# Patient Record
Sex: Male | Born: 1945 | Race: Black or African American | Hispanic: No | Marital: Married | Smoking: Former smoker
Health system: Southern US, Community
[De-identification: ages and names within clinical notes are randomized; demographics above are authoritative.]

## PROBLEM LIST (undated history)

## (undated) DIAGNOSIS — Z9289 Personal history of other medical treatment: Secondary | ICD-10-CM

## (undated) DIAGNOSIS — E119 Type 2 diabetes mellitus without complications: Secondary | ICD-10-CM

## (undated) DIAGNOSIS — D649 Anemia, unspecified: Secondary | ICD-10-CM

## (undated) DIAGNOSIS — H30039 Focal chorioretinal inflammation, peripheral, unspecified eye: Secondary | ICD-10-CM

## (undated) DIAGNOSIS — C61 Malignant neoplasm of prostate: Secondary | ICD-10-CM

## (undated) DIAGNOSIS — K269 Duodenal ulcer, unspecified as acute or chronic, without hemorrhage or perforation: Secondary | ICD-10-CM

## (undated) DIAGNOSIS — I10 Essential (primary) hypertension: Secondary | ICD-10-CM

## (undated) DIAGNOSIS — H4010X0 Unspecified open-angle glaucoma, stage unspecified: Secondary | ICD-10-CM

## (undated) DIAGNOSIS — H44113 Panuveitis, bilateral: Secondary | ICD-10-CM

## (undated) HISTORY — DX: Anemia, unspecified: D64.9

## (undated) HISTORY — PX: ABDOMINAL SURGERY: SHX537

## (undated) HISTORY — PX: CHOLECYSTECTOMY: SHX55

## (undated) HISTORY — PX: EYE SURGERY: SHX253

## (undated) HISTORY — PX: CATARACT EXTRACTION W/ INTRAOCULAR LENS  IMPLANT, BILATERAL: SHX1307

---

## 2002-12-09 ENCOUNTER — Encounter: Payer: Self-pay | Admitting: Emergency Medicine

## 2002-12-09 ENCOUNTER — Emergency Department (HOSPITAL_COMMUNITY): Admission: EM | Admit: 2002-12-09 | Discharge: 2002-12-09 | Payer: Self-pay | Admitting: Emergency Medicine

## 2004-02-22 ENCOUNTER — Emergency Department (HOSPITAL_COMMUNITY): Admission: EM | Admit: 2004-02-22 | Discharge: 2004-02-23 | Payer: Self-pay | Admitting: Emergency Medicine

## 2004-04-26 ENCOUNTER — Emergency Department (HOSPITAL_COMMUNITY): Admission: EM | Admit: 2004-04-26 | Discharge: 2004-04-27 | Payer: Self-pay | Admitting: Emergency Medicine

## 2008-03-28 ENCOUNTER — Emergency Department (HOSPITAL_COMMUNITY): Admission: EM | Admit: 2008-03-28 | Discharge: 2008-03-28 | Payer: Self-pay | Admitting: Emergency Medicine

## 2008-11-30 ENCOUNTER — Emergency Department (HOSPITAL_COMMUNITY): Admission: EM | Admit: 2008-11-30 | Discharge: 2008-11-30 | Payer: Self-pay | Admitting: Family Medicine

## 2009-08-01 ENCOUNTER — Inpatient Hospital Stay (HOSPITAL_COMMUNITY): Admission: EM | Admit: 2009-08-01 | Discharge: 2009-08-04 | Payer: Self-pay | Admitting: Emergency Medicine

## 2009-08-08 ENCOUNTER — Emergency Department (HOSPITAL_COMMUNITY): Admission: EM | Admit: 2009-08-08 | Discharge: 2009-08-08 | Payer: Self-pay | Admitting: Emergency Medicine

## 2009-08-18 ENCOUNTER — Emergency Department (HOSPITAL_COMMUNITY): Admission: EM | Admit: 2009-08-18 | Discharge: 2009-08-18 | Payer: Self-pay | Admitting: Emergency Medicine

## 2010-06-21 ENCOUNTER — Emergency Department (HOSPITAL_COMMUNITY): Admission: EM | Admit: 2010-06-21 | Discharge: 2010-06-21 | Payer: Self-pay | Admitting: Emergency Medicine

## 2010-11-23 LAB — DIFFERENTIAL
Eosinophils Relative: 1 % (ref 0–5)
Lymphocytes Relative: 18 % (ref 12–46)
Lymphs Abs: 1.1 10*3/uL (ref 0.7–4.0)
Monocytes Absolute: 0.7 10*3/uL (ref 0.1–1.0)
Monocytes Relative: 11 % (ref 3–12)

## 2010-11-23 LAB — COMPREHENSIVE METABOLIC PANEL
AST: 61 U/L — ABNORMAL HIGH (ref 0–37)
Albumin: 3.7 g/dL (ref 3.5–5.2)
Calcium: 9.2 mg/dL (ref 8.4–10.5)
Chloride: 102 mEq/L (ref 96–112)
Creatinine, Ser: 1.17 mg/dL (ref 0.4–1.5)
GFR calc Af Amer: >60 mL/min (ref >60)
Total Protein: 6.8 g/dL (ref 6.0–8.3)

## 2010-11-23 LAB — CBC
MCH: 30.1 pg (ref 26.0–34.0)
MCHC: 33.5 g/dL (ref 30.0–36.0)
Platelets: 175 10*3/uL (ref 150–400)
RBC: 3.75 MIL/uL — ABNORMAL LOW (ref 4.22–5.81)
RDW: 13.2 % (ref 11.5–15.5)

## 2010-11-23 LAB — TYPE AND SCREEN
ABO/RH(D): A POS
Antibody Screen: NEGATIVE

## 2010-11-23 LAB — ABO/RH: ABO/RH(D): A POS

## 2010-12-13 LAB — POCT I-STAT, CHEM 8
Calcium, Ion: 1.18 mmol/L (ref 1.12–1.32)
Hemoglobin: 10.5 g/dL — ABNORMAL LOW (ref 13.0–17.0)
Sodium: 141 mEq/L (ref 135–145)
TCO2: 31 mmol/L (ref 0–100)

## 2010-12-14 LAB — POCT CARDIAC MARKERS
CKMB, poc: <1 ng/mL — ABNORMAL LOW (ref 1.0–8.0)
CKMB, poc: <1 ng/mL — ABNORMAL LOW (ref 1.0–8.0)
Myoglobin, poc: 68 ng/mL (ref 12–200)
Troponin i, poc: <0.05 ng/mL (ref 0.00–0.09)

## 2010-12-14 LAB — DIFFERENTIAL
Eosinophils Absolute: 0 10*3/uL (ref 0.0–0.7)
Eosinophils Relative: 0 % (ref 0–5)
Lymphocytes Relative: 8 % — ABNORMAL LOW (ref 12–46)
Lymphs Abs: 1 10*3/uL (ref 0.7–4.0)
Monocytes Absolute: 0.8 10*3/uL (ref 0.1–1.0)
Monocytes Relative: 7 % (ref 3–12)

## 2010-12-14 LAB — COMPREHENSIVE METABOLIC PANEL
ALT: 26 U/L (ref 0–53)
AST: 30 U/L (ref 0–37)
Albumin: 3.6 g/dL (ref 3.5–5.2)
CO2: 27 mEq/L (ref 19–32)
Calcium: 8 mg/dL — ABNORMAL LOW (ref 8.4–10.5)
Chloride: 102 mEq/L (ref 96–112)
GFR calc Af Amer: >60 mL/min (ref >60)
GFR calc non Af Amer: >60 mL/min (ref >60)
Sodium: 136 mEq/L (ref 135–145)

## 2010-12-14 LAB — CBC
HCT: 38.2 % — ABNORMAL LOW (ref 39.0–52.0)
HCT: 39.5 % (ref 39.0–52.0)
Hemoglobin: 13 g/dL (ref 13.0–17.0)
Hemoglobin: 13.6 g/dL (ref 13.0–17.0)
Hemoglobin: 14.3 g/dL (ref 13.0–17.0)
MCHC: 34.3 g/dL (ref 30.0–36.0)
MCHC: 34.9 g/dL (ref 30.0–36.0)
MCV: 92.8 fL (ref 78.0–100.0)
Platelets: 104 10*3/uL — ABNORMAL LOW (ref 150–400)
Platelets: 70 10*3/uL — ABNORMAL LOW (ref 150–400)
RBC: 4.26 MIL/uL (ref 4.22–5.81)
RBC: 4.28 MIL/uL (ref 4.22–5.81)
RDW: 13.8 % (ref 11.5–15.5)
RDW: 14.2 % (ref 11.5–15.5)
WBC: 11.3 10*3/uL — ABNORMAL HIGH (ref 4.0–10.5)
WBC: 15.8 10*3/uL — ABNORMAL HIGH (ref 4.0–10.5)
WBC: 16.1 10*3/uL — ABNORMAL HIGH (ref 4.0–10.5)
WBC: 20.1 10*3/uL — ABNORMAL HIGH (ref 4.0–10.5)

## 2010-12-14 LAB — CK TOTAL AND CKMB (NOT AT ARMC)
CK, MB: 1.2 ng/mL (ref 0.3–4.0)
CK, MB: 1.4 ng/mL (ref 0.3–4.0)
Relative Index: 0.9 (ref 0.0–2.5)
Relative Index: 1 (ref 0.0–2.5)
Relative Index: INVALID (ref 0.0–2.5)
Total CK: 129 U/L (ref 7–232)
Total CK: 81 U/L (ref 7–232)

## 2010-12-14 LAB — BASIC METABOLIC PANEL
BUN: 3 mg/dL — ABNORMAL LOW (ref 6–23)
CO2: 26 mEq/L (ref 19–32)
Calcium: 8.6 mg/dL (ref 8.4–10.5)
Calcium: 9.1 mg/dL (ref 8.4–10.5)
Creatinine, Ser: 1 mg/dL (ref 0.4–1.5)
GFR calc non Af Amer: >60 mL/min (ref >60)
GFR calc non Af Amer: >60 mL/min (ref >60)
Glucose, Bld: 139 mg/dL — ABNORMAL HIGH (ref 70–99)
Glucose, Bld: 147 mg/dL — ABNORMAL HIGH (ref 70–99)
Glucose, Bld: 160 mg/dL — ABNORMAL HIGH (ref 70–99)
Potassium: 4 mEq/L (ref 3.5–5.1)
Sodium: 136 mEq/L (ref 135–145)
Sodium: 137 mEq/L (ref 135–145)

## 2010-12-14 LAB — URINALYSIS, ROUTINE W REFLEX MICROSCOPIC
Nitrite: NEGATIVE
Specific Gravity, Urine: 1.022 (ref 1.005–1.030)
Urobilinogen, UA: 1 mg/dL (ref 0.0–1.0)
pH: 7 (ref 5.0–8.0)

## 2010-12-14 LAB — CULTURE, BLOOD (ROUTINE X 2)

## 2010-12-14 LAB — LIPASE, BLOOD: Lipase: 13 U/L (ref 11–59)

## 2011-02-24 ENCOUNTER — Emergency Department (HOSPITAL_COMMUNITY)
Admission: EM | Admit: 2011-02-24 | Discharge: 2011-02-24 | Disposition: A | Discharge status: Home / Self Care | Payer: Self-pay | Attending: Emergency Medicine | Admitting: Emergency Medicine

## 2011-02-24 DIAGNOSIS — J3489 Other specified disorders of nose and nasal sinuses: Secondary | ICD-10-CM | POA: Insufficient documentation

## 2011-02-24 DIAGNOSIS — J029 Acute pharyngitis, unspecified: Secondary | ICD-10-CM | POA: Insufficient documentation

## 2011-12-05 ENCOUNTER — Emergency Department (HOSPITAL_COMMUNITY)
Admission: EM | Admit: 2011-12-05 | Discharge: 2011-12-05 | Disposition: A | Discharge status: Home / Self Care | Payer: Self-pay | Attending: Emergency Medicine | Admitting: Emergency Medicine

## 2011-12-05 ENCOUNTER — Emergency Department (HOSPITAL_COMMUNITY): Payer: Self-pay

## 2011-12-05 ENCOUNTER — Encounter (HOSPITAL_COMMUNITY): Payer: Self-pay | Admitting: *Deleted

## 2011-12-05 DIAGNOSIS — W010XXA Fall on same level from slipping, tripping and stumbling without subsequent striking against object, initial encounter: Secondary | ICD-10-CM | POA: Insufficient documentation

## 2011-12-05 DIAGNOSIS — M545 Low back pain, unspecified: Secondary | ICD-10-CM

## 2011-12-05 DIAGNOSIS — M5430 Sciatica, unspecified side: Secondary | ICD-10-CM

## 2011-12-05 DIAGNOSIS — M25559 Pain in unspecified hip: Secondary | ICD-10-CM | POA: Insufficient documentation

## 2011-12-05 MED ORDER — PREDNISONE 20 MG PO TABS: 40.0000 mg | ORAL_TABLET | Freq: Every day | ORAL | Status: AC

## 2011-12-05 MED ORDER — PREDNISONE 20 MG PO TABS
40.0000 mg | ORAL_TABLET | Freq: Once | ORAL | Status: AC
  Administered 2011-12-05: 40 mg via ORAL
  Filled 2011-12-05: qty 2

## 2011-12-05 MED ORDER — METAXALONE 800 MG PO TABS: 800.0000 mg | ORAL_TABLET | Freq: Three times a day (TID) | ORAL | Status: DC

## 2011-12-05 MED ORDER — PREDNISONE 20 MG PO TABS: 40.0000 mg | ORAL_TABLET | Freq: Every day | ORAL | Status: DC

## 2011-12-05 MED ORDER — HYDROCODONE-ACETAMINOPHEN 5-325 MG PO TABS
ORAL_TABLET | ORAL | Status: DC
Start: 2011-12-05 — End: 2012-01-12

## 2011-12-05 MED ORDER — HYDROCODONE-ACETAMINOPHEN 5-325 MG PO TABS
2.0000 | ORAL_TABLET | Freq: Once | ORAL | Status: AC
  Administered 2011-12-05: 2 via ORAL
  Filled 2011-12-05: qty 2

## 2011-12-05 MED ORDER — METAXALONE 800 MG PO TABS: 800.0000 mg | ORAL_TABLET | Freq: Three times a day (TID) | ORAL | Status: AC

## 2011-12-05 MED ORDER — HYDROCODONE-ACETAMINOPHEN 5-325 MG PO TABS
ORAL_TABLET | ORAL | Status: DC
Start: 2011-12-05 — End: 2011-12-05

## 2011-12-05 NOTE — Discharge Instructions (Signed)
Back Pain, Adult Low back pain is very common. About 1 in 5 people have back pain.The cause of low back pain is rarely dangerous. The pain often gets better over time.About half of people with a sudden onset of back pain feel better in just 2 weeks. About 8 in 10 people feel better by 6 weeks.  CAUSES Some common causes of back pain include:  Strain of the muscles or ligaments supporting the spine.   Wear and tear (degeneration) of the spinal discs.   Arthritis.   Direct injury to the back.  DIAGNOSIS Most of the time, the direct cause of low back pain is not known.However, back pain can be treated effectively even when the exact cause of the pain is unknown.Answering your caregiver's questions about your overall health and symptoms is one of the most accurate ways to make sure the cause of your pain is not dangerous. If your caregiver needs more information, he or she may order lab work or imaging tests (X-rays or MRIs).However, even if imaging tests show changes in your back, this usually does not require surgery. HOME CARE INSTRUCTIONS For many people, back pain returns.Since low back pain is rarely dangerous, it is often a condition that people can learn to manageon their own.   Remain active. It is stressful on the back to sit or stand in one place. Do not sit, drive, or stand in one place for more than 30 minutes at a time. Take short walks on level surfaces as soon as pain allows.Try to increase the length of time you walk each day.   Do not stay in bed.Resting more than 1 or 2 days can delay your recovery.   Do not avoid exercise or work.Your body is made to move.It is not dangerous to be active, even though your back may hurt.Your back will likely heal faster if you return to being active before your pain is gone.   Pay attention to your body when you bend and lift. Many people have less discomfortwhen lifting if they bend their knees, keep the load close to their  bodies,and avoid twisting. Often, the most comfortable positions are those that put less stress on your recovering back.   Find a comfortable position to sleep. Use a firm mattress and lie on your side with your knees slightly bent. If you lie on your back, put a pillow under your knees.   Only take over-the-counter or prescription medicines as directed by your caregiver. Over-the-counter medicines to reduce pain and inflammation are often the most helpful.Your caregiver may prescribe muscle relaxant drugs.These medicines help dull your pain so you can more quickly return to your normal activities and healthy exercise.   Put ice on the injured area.   Put ice in a plastic bag.   Place a towel between your skin and the bag.   Leave the ice on for 15 to 20 minutes, 3 to 4 times a day for the first 2 to 3 days. After that, ice and heat may be alternated to reduce pain and spasms.   Ask your caregiver about trying back exercises and gentle massage. This may be of some benefit.   Avoid feeling anxious or stressed.Stress increases muscle tension and can worsen back pain.It is important to recognize when you are anxious or stressed and learn ways to manage it.Exercise is a great option.  SEEK MEDICAL CARE IF:  You have pain that is not relieved with rest or medicine.   You have   pain that does not improve in 1 week.   You have new symptoms.   You are generally not feeling well.  SEEK IMMEDIATE MEDICAL CARE IF:   You have pain that radiates from your back into your legs.   You develop new bowel or bladder control problems.   You have unusual weakness or numbness in your arms or legs.   You develop nausea or vomiting.   You develop abdominal pain.   You feel faint.  Document Released: 08/28/2005 Document Revised: 08/17/2011 Document Reviewed: 01/16/2011 [facility] Patient Information 2012 [place], [place].     Sciatica Sciatica is a weakness and/or changes in sensation  (tingling, jolts, hot and cold, numbness) along the path the sciatic nerve travels. Irritation or damage to lumbar nerve roots is often also referred to as lumbar radiculopathy.  Lumbar radiculopathy (Sciatica) is the most common form of this problem. Radiculopathy can occur in any of the nerves coming out of the spinal cord. The problems caused depend on which nerves are involved. The sciatic nerve is the large nerve supplying the branches of nerves going from the hip to the toes. It often causes a numbness or weakness in the skin and/or muscles that the sciatic nerve serves. It also may cause symptoms (problems) of pain, burning, tingling, or electric shock-like feelings in the path of this nerve. This usually comes from injury to the fibers that make up the sciatic nerve. Some of these symptoms are low back pain and/or unpleasant feelings in the following areas:  From the mid-buttock down the back of the leg to the back of the knee.   And/or the outside of the calf and top of the foot.   And/or behind the inner ankle to the sole of the foot.  CAUSES   Herniated or slipped disc. Discs are the little cushions between the bones in the back.   Pressure by the piriformis muscle in the buttock on the sciatic nerve (Piriformis Syndrome).   Misalignment of the bones in the lower back and buttocks (Sacroiliac Joint Derangement).   Narrowing of the spinal canal that puts pressure on or pinches the fibers that make up the sciatic nerve.   A slipped vertebra that is out of line with those above or beneath it.   Abnormality of the nervous system itself so that nerve fibers do not transmit signals properly, especially to feet and calves (neuropathy).   Tumor (this is rare).  Your caregiver can usually determine the cause of your sciatica and begin the treatment most likely to help you. TREATMENT  Taking over-the-counter painkillers, physical therapy, rest, exercise, spinal manipulation, and injections  of anesthetics and/or steroids may be used. Surgery, acupuncture, and Yoga can also be effective. Mind over matter techniques, mental imagery, and changing factors such as your bed, chair, desk height, posture, and activities are other treatments that may be helpful. You and your caregiver can help determine what is best for you. With proper diagnosis, the cause of most sciatica can be identified and removed. Communication and cooperation between your caregiver and you is essential. If you are not successful immediately, do not be discouraged. With time, a proper treatment can be found that will make you comfortable. HOME CARE INSTRUCTIONS   If the pain is coming from a problem in the back, applying ice to that area for 15 to 20 minutes, 3 to 4 times per day while awake, may be helpful. Put the ice in a plastic bag. Place a towel between the  bag of ice and your skin.   You may exercise or perform your usual activities if these do not aggravate your pain, or as suggested by your caregiver.   Only take over-the-counter or prescription medicines for pain, discomfort, or fever as directed by your caregiver.   If your caregiver has given you a follow-up appointment, it is very important to keep that appointment. Not keeping the appointment could result in a chronic or permanent injury, pain, and disability. If there is any problem keeping the appointment, you must call back to this facility for assistance.  SEEK IMMEDIATE MEDICAL CARE IF:   You experience loss of control of bowel or bladder.   You have increasing weakness in the trunk, buttocks, or legs.   There is numbness in any areas from the hip down to the toes.   You have difficulty walking or keeping your balance.   You have any of the above, with fever or forceful vomiting.  Document Released: 08/22/2001 Document Revised: 08/17/2011 Document Reviewed: 04/10/2008 [facility] Patient Information 2012 [place], [place].    Narcotic and  benzodiazepine use may cause drowsiness, slowed breathing or dependence.  Please use with caution and do not drive, operate machinery or watch young children alone while taking them.  Taking combinations of these medications or drinking alcohol will potentiate these effects.

## 2011-12-05 NOTE — ED Notes (Signed)
Pt hurt his hip when he fell 10 days ago.  Pt continues to have soreness in his left hip, he states that he thinks he has a "pulled muscle" also.  Ambulatory. No weakness

## 2011-12-05 NOTE — ED Notes (Signed)
Pt fell on his R side, but twisted, 10 days prior.  Pain is getting increasingly worse.  Pain increases with bending, and when he first starts standing from a sitting position.

## 2011-12-05 NOTE — ED Provider Notes (Signed)
History     CSN: [id number]  Arrival date & time 12/05/11  0142   First MD Initiated Contact with Patient 12/05/11 0424      Chief Complaint  Patient presents with  . Hip Pain    (Consider location/radiation/quality/duration/timing/severity/associated sxs/prior treatment) HPI Comments: Pt reports he did fall onto right side after tripping about 7 days ago, a few days later has been having pain to left low back with radiation into hip area.  No weakness, no fevers, chills, no weakness to leg, has been able to walk.  Denies any other sig trauma, no bowel or bladder incontinence.  Has not taken any OTC meds, just icy hot to area tonight without much relief.  No skin rash.  No abd pain, N/V/D.    The history is provided by the patient.    History reviewed. No pertinent past medical history.  History reviewed. No pertinent past surgical history.  No family history on file.  History  Substance Use Topics  . Smoking status: Current Everyday Smoker -- 0.5 packs/day    Types: Cigarettes  . Smokeless tobacco: Not on file  . Alcohol Use: Yes      Review of Systems  Constitutional: Negative for fever and chills.  Gastrointestinal: Negative for nausea, vomiting, abdominal pain, diarrhea and blood in stool.  Musculoskeletal: Positive for back pain and arthralgias.  Skin: Negative for rash and wound.  Neurological: Negative for weakness and numbness.    Allergies  Penicillins  Home Medications   Current Outpatient Rx  Name Route Sig Dispense Refill  . HYDROCODONE-ACETAMINOPHEN 5-325 MG PO TABS  1-2 tablets po q 6 hours prn moderate to severe pain 20 tablet 0  . METAXALONE 800 MG PO TABS Oral Take 1 tablet (800 mg total) by mouth 3 (three) times daily. 21 tablet 0  . PREDNISONE 20 MG PO TABS Oral Take 2 tablets (40 mg total) by mouth daily. 12 tablet 0    BP 138/85  Pulse 89  Temp(Src) 98.8 F (37.1 C) (Oral)  Resp 18  SpO2 98%  Physical Exam  Nursing note and vitals  reviewed. Constitutional: He appears well-developed and well-nourished.  HENT:  Head: Normocephalic and atraumatic.  Eyes: Pupils are equal, round, and reactive to light.  Cardiovascular: Normal rate and regular rhythm.   Pulmonary/Chest: Effort normal.  Abdominal: Soft. Bowel sounds are normal. He exhibits no distension. There is no tenderness.  Neurological: He is alert. He has normal strength. No sensory deficit.  Reflex Scores:      Patellar reflexes are 2+ on the right side and 2+ on the left side. Skin: Skin is warm.    ED Course  Procedures (including critical care time)  Labs Reviewed - No data to display Dg Hip Complete Left  12/05/2011  *RADIOLOGY REPORT*  Clinical Data: Worsening left hip pain.  LEFT HIP - COMPLETE 2+ VIEW  Comparison: None.  Findings: There is no evidence of fracture or dislocation.  Both femoral heads are seated normally within their respective acetabula.  The proximal left femur appears intact.  No significant degenerative change is appreciated.  The sacroiliac joints are unremarkable in appearance.  The visualized bowel gas pattern is grossly unremarkable in appearance.  Scattered phleboliths are noted within the pelvis.  IMPRESSION: No evidence of fracture or dislocation.  Original Report Authenticated By: Tonia Ghent, M.D.     1. Sciatica   2. Low back pain       MDM  Exam is consistent with sciatica  and low back pains.  Plain film of hip ordered by protocol prior to my assessment.  Will try prednisone, skelaxin and norco at home.  Pt is urged to follow up with PCP for follow up in 1 week.  He can return for sudden worse pain, fevers, weakness.          Gavin Pound. Farhan Jean, MD 12/05/11 0430

## 2012-01-12 ENCOUNTER — Emergency Department (HOSPITAL_COMMUNITY)
Admission: EM | Admit: 2012-01-12 | Discharge: 2012-01-13 | Disposition: A | Discharge status: Home / Self Care | Payer: Self-pay | Attending: Emergency Medicine | Admitting: Emergency Medicine

## 2012-01-12 ENCOUNTER — Encounter (HOSPITAL_COMMUNITY): Payer: Self-pay | Admitting: *Deleted

## 2012-01-12 DIAGNOSIS — R0602 Shortness of breath: Secondary | ICD-10-CM | POA: Insufficient documentation

## 2012-01-12 DIAGNOSIS — F17200 Nicotine dependence, unspecified, uncomplicated: Secondary | ICD-10-CM | POA: Insufficient documentation

## 2012-01-12 DIAGNOSIS — J029 Acute pharyngitis, unspecified: Secondary | ICD-10-CM | POA: Insufficient documentation

## 2012-01-12 NOTE — ED Notes (Addendum)
C/o sore throat, hurts to swallow, cough, throat swelling, hard to breathe when lying down, BMs loose, (able to keep liquids down), (denies: nv, dizziness or fever). Throat red, irritated, swollen, R=L, neck tender to palpation, no obvious exudate. Now denies cough. Speech alterred.

## 2012-01-13 LAB — DIFFERENTIAL
Eosinophils Absolute: 0.1 10*3/uL (ref 0.0–0.7)
Eosinophils Relative: 1 % (ref 0–5)
Lymphs Abs: 1.2 10*3/uL (ref 0.7–4.0)
Monocytes Relative: 13 % — ABNORMAL HIGH (ref 3–12)

## 2012-01-13 LAB — CBC
Hemoglobin: 13.1 g/dL (ref 13.0–17.0)
MCH: 30 pg (ref 26.0–34.0)
MCV: 90.1 fL (ref 78.0–100.0)
RBC: 4.36 MIL/uL (ref 4.22–5.81)

## 2012-01-13 MED ORDER — HYDROCODONE-ACETAMINOPHEN 5-325 MG PO TABS: 1.0000 | ORAL_TABLET | Freq: Four times a day (QID) | ORAL | Status: AC | PRN

## 2012-01-13 NOTE — ED Provider Notes (Signed)
History     CSN: [id number]  Arrival date & time 01/12/12  2320   First MD Initiated Contact with Patient 01/13/12 0014      Chief Complaint  Patient presents with  . Sore Throat  . Shortness of Breath    (Consider location/radiation/quality/duration/timing/severity/associated sxs/prior treatment) Patient is a 66 y.o. male presenting with pharyngitis.  Sore Throat This is a new problem. The current episode started more than 2 days ago. The problem occurs constantly. The problem has not changed since onset.Associated symptoms include shortness of breath. Pertinent negatives include no chest pain, no abdominal pain and no headaches. The symptoms are aggravated by swallowing. The symptoms are relieved by nothing.   History of sore throat for the past 3 days no real exertional shortness of breath just occasionally feels a little short of breath with the pain of the throat when trying to swallow but no shortness of breath with walking around. No significant cough or productive cough some mild congestion which he attributed to seasonal allergies and does not feel like he has an upper respiratory infection no true fever. Has been taking cold and flu-type medicine. Patient rates the throat pain as a 10 out of 10. Not associated with chest pain headache abdominal pain nausea or vomiting or diarrhea. History reviewed. No pertinent past medical history.  Past Surgical History  Procedure Date  . Cholecystectomy   . Abdominal surgery     No family history on file.  History  Substance Use Topics  . Smoking status: Current Everyday Smoker -- 0.5 packs/day    Types: Cigarettes  . Smokeless tobacco: Not on file  . Alcohol Use: Yes      Review of Systems  Constitutional: Negative for fever and chills.  HENT: Positive for congestion, sore throat and trouble swallowing. Negative for ear pain and neck pain.   Eyes: Negative for redness.  Respiratory: Positive for shortness of breath.     Cardiovascular: Negative for chest pain.  Gastrointestinal: Negative for nausea, vomiting, abdominal pain and diarrhea.  Genitourinary: Negative for dysuria.  Musculoskeletal: Negative for back pain.  Skin: Negative for rash.  Neurological: Negative for headaches.  Hematological: Negative for adenopathy.    Allergies  Penicillins  Home Medications   Current Outpatient Rx  Name Route Sig Dispense Refill  . ALKA-SELTZER PLUS DAY COLD/FLU PO Oral Take 2 tablets by mouth once.    Juanita Laster COLD & SORE THROAT PO Oral Take 1 tablet by mouth once. Sore throat and cold    . ALKA-SELTZER PLS NIGHT CLD/FLU PO Oral Take 2 tablets by mouth once. Sore throat and  cold    . HYDROCODONE-ACETAMINOPHEN 5-325 MG PO TABS Oral Take 1-2 tablets by mouth every 6 (six) hours as needed for pain. 10 tablet 0    BP 153/91  Pulse 74  Temp(Src) 99.8 F (37.7 C) (Oral)  Resp 22  SpO2 99%  Physical Exam  Nursing note and vitals reviewed. Constitutional: He is oriented to person, place, and time. He appears well-developed and well-nourished. No distress.  HENT:  Head: Normocephalic and atraumatic.  Mouth/Throat: Oropharynx is clear and moist. No oropharyngeal exudate.  Eyes: Conjunctivae and EOM are normal. Pupils are equal, round, and reactive to light.  Neck: Normal range of motion. Neck supple. No tracheal deviation present. No thyromegaly present.  Cardiovascular: Normal rate, regular rhythm and normal heart sounds.   No murmur heard. Pulmonary/Chest: Effort normal and breath sounds normal. No stridor. No respiratory distress. He has no  wheezes. He has no rales. He exhibits no tenderness.  Abdominal: Soft. Bowel sounds are normal. There is no tenderness.  Musculoskeletal: Normal range of motion. He exhibits no edema and no tenderness.  Lymphadenopathy:    He has no cervical adenopathy.  Neurological: He is alert and oriented to person, place, and time. No cranial nerve deficit. He exhibits normal  muscle tone. Coordination normal.  Skin: Skin is warm. No rash noted.    ED Course  Procedures (including critical care time)  Labs Reviewed  CBC - Abnormal; Notable for the following:    WBC 11.1 (*)    All other components within normal limits  DIFFERENTIAL - Abnormal; Notable for the following:    Neutro Abs 8.4 (*)    Lymphocytes Relative 11 (*)    Monocytes Relative 13 (*)    Monocytes Absolute 1.4 (*)    All other components within normal limits  RAPID STREP SCREEN   No results found.   1. Pharyngitis       MDM  Patient with 3 day history of sore throat some mild congestion he thinks related more to allergies but does not feel like an upper respiratory infection to the patient. Today's workup negative for strep pharyngitis based on the rapid strep test throat without any significant abnormalities or significant erythema or exudate. Patient does have a history of some seasonal allergies could be related to that somewhat recommended trying Zrytec the antihistamine if not resolved in the next 3 days to follow up with ear nose and throat for a closer look to make sure is no significant laryngeal pathology going on. It is still possible that a viral upper respiratory infection is the etiology for the pharyngitis.        Shelda Jakes, MD 01/13/12 0157

## 2012-01-13 NOTE — Discharge Instructions (Signed)
Sore Throat A sore throat is felt inside the throat and at the back of the mouth. It hurts to swallow or the throat may feel dry and scratchy. It can be caused by germs, smoking, pollution, or allergies.  HOME CARE   Only take medicine as told by your doctor.   Drink enough fluids to keep your pee (urine) clear or pale yellow.   Eat soft foods.   Do not smoke.   Rinse the mouth (gargle) with warm water or salt water ( teaspoon salt in 8 ounces of water).   Try throat sprays, lozenges, or suck on hard candy.  GET HELP RIGHT AWAY IF:   You have trouble breathing.   Your sore throat lasts longer than 1 week.   There is more puffiness (swelling) in the throat.   The pain is so bad that you are unable to swallow.   You have a very bad headache or a red rash.   You start to throw up (vomit).   You or your child has a temperature by mouth above 102 F (38.9 C), not controlled by medicine.   Your baby is older than 3 months with a rectal temperature of 102 F (38.9 C) or higher.   Your baby is 74 months old or younger with a rectal temperature of 100.4 F (38 C) or higher.  MAKE SURE YOU:   Understand these instructions.   Will watch your condition.   Will get help right away if you are not doing well or get worse.  Document Released: 06/06/2008 Document Revised: 08/17/2011 Document Reviewed: 06/06/2008 [facility] Patient Information 2012 [place], [place].    Sore throat may be viral in nature or may not be related to a virus at all. If not resolved in the next 3 days important that you follow up with ear nose and throat to take a closer look at the potential cause. Based on today's testing not related to strep. Return for any new or worse symptoms.

## 2012-03-13 ENCOUNTER — Emergency Department (HOSPITAL_COMMUNITY)
Admission: EM | Admit: 2012-03-13 | Discharge: 2012-03-13 | Disposition: A | Discharge status: Home / Self Care | Payer: Medicaid Other | Attending: Emergency Medicine | Admitting: Emergency Medicine

## 2012-03-13 ENCOUNTER — Encounter (HOSPITAL_COMMUNITY): Payer: Self-pay

## 2012-03-13 DIAGNOSIS — T148XXA Other injury of unspecified body region, initial encounter: Secondary | ICD-10-CM

## 2012-03-13 DIAGNOSIS — F17200 Nicotine dependence, unspecified, uncomplicated: Secondary | ICD-10-CM | POA: Insufficient documentation

## 2012-03-13 DIAGNOSIS — M5430 Sciatica, unspecified side: Secondary | ICD-10-CM | POA: Insufficient documentation

## 2012-03-13 LAB — POCT I-STAT, CHEM 8
BUN: 5 mg/dL — ABNORMAL LOW (ref 6–23)
Calcium, Ion: 1.25 mmol/L (ref 1.12–1.32)
Creatinine, Ser: 1.2 mg/dL (ref 0.50–1.35)
TCO2: 28 mmol/L (ref 0–100)

## 2012-03-13 MED ORDER — CYCLOBENZAPRINE HCL 5 MG PO TABS: 5.0000 mg | ORAL_TABLET | Freq: Three times a day (TID) | ORAL | Status: AC | PRN

## 2012-03-13 MED ORDER — HYDROCODONE-ACETAMINOPHEN 5-325 MG PO TABS: 1.0000 | ORAL_TABLET | Freq: Four times a day (QID) | ORAL | Status: AC | PRN

## 2012-03-13 MED ORDER — OXYCODONE-ACETAMINOPHEN 5-325 MG PO TABS
1.0000 | ORAL_TABLET | Freq: Once | ORAL | Status: AC
  Administered 2012-03-13: 1 via ORAL
  Filled 2012-03-13: qty 1

## 2012-03-13 MED ORDER — KETOROLAC TROMETHAMINE 60 MG/2ML IM SOLN
60.0000 mg | Freq: Once | INTRAMUSCULAR | Status: AC
  Administered 2012-03-13: 60 mg via INTRAMUSCULAR
  Filled 2012-03-13: qty 2

## 2012-03-13 MED ORDER — DOCUSATE SODIUM 100 MG PO CAPS: 100.0000 mg | ORAL_CAPSULE | Freq: Two times a day (BID) | ORAL | Status: AC

## 2012-03-13 MED ORDER — IBUPROFEN 600 MG PO TABS: 600.0000 mg | ORAL_TABLET | Freq: Two times a day (BID) | ORAL | Status: AC

## 2012-03-13 NOTE — ED Notes (Signed)
Pt presents with 3 day h/o low back pain, denies any injury.  Pt reports "problems" with back, but reports x 2 days ago, pain radiated from lower back into L groin and into L testicle.  Pt denies any swelling to testicles, denies any difficulty voiding.

## 2012-03-13 NOTE — ED Notes (Signed)
Family member escorted back to room to see patient.

## 2012-03-13 NOTE — ED Provider Notes (Signed)
History     CSN: [id number]  Arrival date & time 03/13/12  1437   First MD Initiated Contact with Patient 03/13/12 1757      Chief Complaint  Patient presents with  . Groin Pain    (Consider location/radiation/quality/duration/timing/severity/associated sxs/prior treatment) HPI Comments: Patient with a significant past medical history presents emergency department with a chief complaint of left low back that radiates to the left groin.  Patient was evaluated for similar pain back in March which resolved, however he recently strained his lower back last Wednesday while at work.  Patient describes picking up heavy cement and then having a shooting pain from his left back down to his left testicle.  Patient denies any swelling or erythema of testicles, dysuria, hematuria, difficulty voiding, numbness or tingling of lower extremities, inability to ambulate, loss control of bowel or bladder, unilateral weakness, fever, night sweats, or chills.  Patient did not have a history of cancer, diabetes, steroid use, or any other reason to be immunocompromise.  Patient has no other complaints at this time.  The history is provided by the patient.    History reviewed. No pertinent past medical history.  Past Surgical History  Procedure Date  . Cholecystectomy   . Abdominal surgery     No family history on file.  History  Substance Use Topics  . Smoking status: Current Everyday Smoker -- 0.5 packs/day    Types: Cigarettes  . Smokeless tobacco: Not on file  . Alcohol Use: Yes      Review of Systems  Constitutional: Negative for fever, chills and appetite change.  HENT: Negative for congestion.   Eyes: Negative for visual disturbance.  Respiratory: Negative for shortness of breath.   Cardiovascular: Negative for chest pain and leg swelling.  Gastrointestinal: Negative for abdominal pain.  Genitourinary: Negative for dysuria, urgency and frequency.  Musculoskeletal: Positive for myalgias  and arthralgias.  Neurological: Negative for dizziness, syncope, weakness, light-headedness, numbness and headaches.  Psychiatric/Behavioral: Negative for confusion.  All other systems reviewed and are negative.    Allergies  Penicillins  Home Medications   Current Outpatient Rx  Name Route Sig Dispense Refill  . IBUPROFEN 200 MG PO TABS Oral Take 400 mg by mouth every 6 (six) hours as needed. For pain      BP 141/86  Pulse 75  Temp 98.7 F (37.1 C) (Oral)  Resp 18  Ht 5\' 11"  (1.803 m)  Wt 215 lb (97.523 kg)  BMI 29.99 kg/m2  SpO2 100%  Physical Exam  Nursing note and vitals reviewed. Constitutional: He is oriented to person, place, and time. He appears well-developed and well-nourished. No distress.  HENT:  Head: Normocephalic and atraumatic.  Eyes: Conjunctivae and EOM are normal. Pupils are equal, round, and reactive to light. No scleral icterus.  Neck: Normal range of motion and full passive range of motion without pain. Neck supple. No spinous process tenderness and no muscular tenderness present. No rigidity. Normal range of motion present. No Brudzinski's sign noted.  Cardiovascular: Normal rate, regular rhythm and intact distal pulses.  Exam reveals no gallop and no friction rub.   No murmur heard. Pulmonary/Chest: Effort normal and breath sounds normal. No respiratory distress. He has no wheezes. He has no rales. He exhibits no tenderness.  Genitourinary:       Exam chaperoned, penis normal, non-circumcised without discharge.  Scrotum nontender without erythema or swelling.  Cremaster reflex intact.  Musculoskeletal: Normal range of motion.       Tender  to palpation over SI region and left lower musculature.  Left flexion and fwd flexion induces radiation of pain into groin.  Other range of motion testing pain-free including passive internal & external rotation of hips.   Neurological: He is alert and oriented to person, place, and time. He has normal strength and  normal reflexes. No sensory deficit. Gait (no ataxia, slowed and hunched d/t pain ) abnormal.       Sensation at baseline for light touch in all 4 distal extremities, motor symmetric & bilateral 5/5 (hips: abduction, adduction, flexion; knee: flexion & extension; foot: dorsiflexion, plantar flexion, toes: dorsi flexion) Patellar & ankle reflexes intact.   Skin: Skin is warm and dry. No rash noted. He is not diaphoretic. No erythema. No pallor.  Psychiatric: He has a normal mood and affect. His behavior is normal.    ED Course  Procedures (including critical care time)  Labs Reviewed  POCT I-STAT, CHEM 8 - Abnormal; Notable for the following:    BUN 5 (*)     Glucose, Bld 118 (*)     All other components within normal limits   No results found.   No diagnosis found.    MDM  Sciatica  Patient with muscular strain and sciatica from lumbar to groin.  Treated in the emergency department with Toradol once renal function was evaluated.  Patient will be given a short course of anti-inflammatories, painkillers, and instructions for conservative home remedies.  Patient advised to followup with PCP & will be given a resource guide.        Jaci Carrel, PA-C 03/13/12 2051  Jaci Carrel, PA-C 03/13/12 2052

## 2012-03-18 NOTE — ED Provider Notes (Signed)
Medical screening examination/treatment/procedure(s) were performed by non-physician practitioner and as supervising physician I was immediately available for consultation/collaboration.  Brodrick Curran, MD 03/18/12 0726 

## 2012-04-24 ENCOUNTER — Encounter (HOSPITAL_COMMUNITY): Payer: Self-pay

## 2012-04-24 ENCOUNTER — Emergency Department (HOSPITAL_COMMUNITY): Payer: Medicaid Other

## 2012-04-24 ENCOUNTER — Emergency Department (HOSPITAL_COMMUNITY)
Admission: EM | Admit: 2012-04-24 | Discharge: 2012-04-24 | Disposition: A | Discharge status: Home / Self Care | Payer: Medicaid Other | Attending: Emergency Medicine | Admitting: Emergency Medicine

## 2012-04-24 DIAGNOSIS — Z9089 Acquired absence of other organs: Secondary | ICD-10-CM | POA: Insufficient documentation

## 2012-04-24 DIAGNOSIS — J3489 Other specified disorders of nose and nasal sinuses: Secondary | ICD-10-CM

## 2012-04-24 DIAGNOSIS — Z9889 Other specified postprocedural states: Secondary | ICD-10-CM | POA: Insufficient documentation

## 2012-04-24 DIAGNOSIS — I10 Essential (primary) hypertension: Secondary | ICD-10-CM | POA: Insufficient documentation

## 2012-04-24 DIAGNOSIS — F17200 Nicotine dependence, unspecified, uncomplicated: Secondary | ICD-10-CM | POA: Insufficient documentation

## 2012-04-24 DIAGNOSIS — Z880 Allergy status to penicillin: Secondary | ICD-10-CM | POA: Insufficient documentation

## 2012-04-24 MED ORDER — ACETAMINOPHEN-CODEINE #3 300-30 MG PO TABS: 1.0000 | ORAL_TABLET | Freq: Four times a day (QID) | ORAL | Status: AC | PRN

## 2012-04-24 MED ORDER — LORATADINE 10 MG PO TABS: 10.0000 mg | ORAL_TABLET | Freq: Every day | ORAL | Status: DC

## 2012-04-24 MED ORDER — AZITHROMYCIN 250 MG PO TABS: 250.0000 mg | ORAL_TABLET | Freq: Every day | ORAL | Status: AC

## 2012-04-24 NOTE — ED Notes (Signed)
Pt c/o nasal/chest congestion x several days. No acute distress. Pt has not taken any decongestants.

## 2012-04-24 NOTE — ED Notes (Signed)
Pt d/c home in NAD. Pt voiced understanding of d/c instructions and follow up care. Pt instructed not to drive after taking tylenol #3. Pt able to ambulate with quick, steady gait.

## 2012-04-24 NOTE — ED Notes (Signed)
Pt also reports chest congestion but unable to cough anything up

## 2012-04-24 NOTE — ED Provider Notes (Signed)
History   This chart was scribed for Gavin Pound. Oletta Lamas, MD by Charolett Bumpers . The patient was seen in room TR11C/TR11C. Patient's care was started at 1518.    CSN: [id number]  Arrival date & time 04/24/12  1355   First MD Initiated Contact with Patient 04/24/12 1518      Chief Complaint  Patient presents with  . Headache    (Consider location/radiation/quality/duration/timing/severity/associated sxs/prior treatment) HPI Danny Mccall is a 66 y.o. male who presents to the Emergency Department complaining of constant, moderate sinus pressure and pain in frontal face, not really in head with an onset of this morning when he woke up. Pt reports associated no dizziness, but some mild congestion and post nasal drainage. Pt also reports a productive cough, but states that this is not a new symptoms as he smokes. Pt denies any fevers, sneezing or headaches. Pt reports that he hasn't taken anything for his symptoms. Pt reports he is a smoker. Pt denies any h/o headaches. Pt denies any abnormal gait or weakness in his extremities. Pt reports a h/o HTN.   History reviewed. No pertinent past medical history.  Past Surgical History  Procedure Date  . Cholecystectomy   . Abdominal surgery     History reviewed. No pertinent family history.  History  Substance Use Topics  . Smoking status: Current Everyday Smoker -- 0.5 packs/day    Types: Cigarettes  . Smokeless tobacco: Not on file  . Alcohol Use: Yes      Review of Systems  Constitutional: Negative for fever, chills and activity change.  HENT: Positive for congestion, postnasal drip and sinus pressure. Negative for sore throat, sneezing, trouble swallowing, neck pain, neck stiffness and dental problem.   Eyes: Negative for visual disturbance.  Respiratory: Positive for cough. Negative for shortness of breath.   Gastrointestinal: Negative for nausea and vomiting.  Musculoskeletal: Negative for gait problem.  Skin:  Negative for rash.  Neurological: Positive for dizziness and headaches. Negative for weakness and light-headedness.  All other systems reviewed and are negative.    Allergies  Penicillins  Home Medications   Current Outpatient Rx  Name Route Sig Dispense Refill  . IBUPROFEN 200 MG PO TABS Oral Take 400 mg by mouth every 6 (six) hours as needed. For pain    . SODIUM & POTASSIUM BICARBONATE PO TBEF Oral Take 1 tablet by mouth daily as needed. Stomach indigestion    . ACETAMINOPHEN-CODEINE #3 300-30 MG PO TABS Oral Take 1-2 tablets by mouth every 6 (six) hours as needed for pain. 15 tablet 0  . AZITHROMYCIN 250 MG PO TABS Oral Take 1 tablet (250 mg total) by mouth daily. 6 tablet 0  . LORATADINE 10 MG PO TABS Oral Take 1 tablet (10 mg total) by mouth daily. 20 tablet 0    BP 148/89  Pulse 70  Temp 97.8 F (36.6 C) (Oral)  Resp 20  SpO2 100%  Physical Exam  Nursing note and vitals reviewed. Constitutional: He is oriented to person, place, and time. He appears well-developed and well-nourished. No distress.  HENT:  Head: Normocephalic and atraumatic.  Nose: Nose normal.  Mouth/Throat: Uvula is midline and oropharynx is clear and moist. No oropharyngeal exudate.       No sinus tenderness.   Eyes: Conjunctivae and EOM are normal. Pupils are equal, round, and reactive to light. Right eye exhibits no discharge. Left eye exhibits no discharge. No scleral icterus.  Neck: Normal range of motion. Neck supple. No  tracheal deviation present.  Cardiovascular: Normal rate and regular rhythm.   No murmur heard. Pulmonary/Chest: Effort normal. No respiratory distress.  Musculoskeletal: Normal range of motion.  Lymphadenopathy:    He has no cervical adenopathy.  Neurological: He is alert and oriented to person, place, and time. No cranial nerve deficit.       Finger nose test normal. Normal coordination. Cranial nerves 2-12 intact.   Skin: Skin is warm and dry.  Psychiatric: He has a normal  mood and affect. His behavior is normal.    ED Course  Procedures (including critical care time)  DIAGNOSTIC STUDIES: Oxygen Saturation is 100% on room air, normal by my interpretation.    COORDINATION OF CARE:  15:40-Discussed planned course of treatment with the patient, who is agreeable at this time.     Labs Reviewed - No data to display Ct Head Wo Contrast  04/24/2012  *RADIOLOGY REPORT*  Clinical Data: 66 year old male with dizziness, headaches.  CT HEAD WITHOUT CONTRAST  Technique:  Contiguous axial images were obtained from the base of the skull through the vertex without contrast.  Comparison: 08/01/2009.  Findings: Visualized paranasal sinuses and mastoids are clear.  No acute osseous abnormality identified.  Leftward gaze deviation, otherwise negative orbit and scalp soft tissues.  Cerebral volume is within normal limits for age.  No midline shift, ventriculomegaly, mass effect, evidence of mass lesion, intracranial hemorrhage or evidence of cortically based acute infarction.  Gray-white matter differentiation is within normal limits throughout the brain.  No suspicious intracranial vascular hyperdensity.  IMPRESSION: Stable and normal noncontrast CT appearance of the brain.  Original Report Authenticated By: Harley Hallmark, M.D.     1. Sinus pressure       MDM  I personally performed the services described in this documentation, which was scribed in my presence. The recorded information has been reviewed and considered.   No focal deficits.  No obv findings consistent with sinus infection or acute sinusitis.  Head CT is grossly normal which I reviewed myself and reviewed radiologist interpretation.  Will treat as sinus infection with z pak, claritin and Tylenol #3 for pain.         Gavin Pound. Saren Corkern, MD 04/26/12 1547

## 2012-04-24 NOTE — Discharge Instructions (Signed)
 Narcotic and benzodiazepine use may cause drowsiness, slowed breathing or dependence.  Please use with caution and do not drive, operate machinery or watch young children alone while taking them.  Taking combinations of these medications or drinking alcohol will potentiate these effects.      Your CT scan was normal.  Take medications for sinus infection and follow up with your own physician next week to make sure that things are improved.

## 2012-04-24 NOTE — ED Notes (Addendum)
Pt reports sinus pressure, drainage, seeing whites spots x1 in front of his eyes starting this am. Nad, a/o x4, no neuro deficits

## 2013-05-10 ENCOUNTER — Encounter (HOSPITAL_COMMUNITY): Payer: Self-pay

## 2013-05-10 ENCOUNTER — Emergency Department (HOSPITAL_COMMUNITY)
Admission: EM | Admit: 2013-05-10 | Discharge: 2013-05-10 | Disposition: A | Discharge status: Home / Self Care | Payer: Medicare Other | Attending: Emergency Medicine | Admitting: Emergency Medicine

## 2013-05-10 DIAGNOSIS — M545 Low back pain, unspecified: Secondary | ICD-10-CM | POA: Insufficient documentation

## 2013-05-10 DIAGNOSIS — Z8589 Personal history of malignant neoplasm of other organs and systems: Secondary | ICD-10-CM | POA: Insufficient documentation

## 2013-05-10 DIAGNOSIS — Z79899 Other long term (current) drug therapy: Secondary | ICD-10-CM | POA: Insufficient documentation

## 2013-05-10 DIAGNOSIS — Z880 Allergy status to penicillin: Secondary | ICD-10-CM | POA: Insufficient documentation

## 2013-05-10 DIAGNOSIS — F17200 Nicotine dependence, unspecified, uncomplicated: Secondary | ICD-10-CM | POA: Insufficient documentation

## 2013-05-10 DIAGNOSIS — M549 Dorsalgia, unspecified: Secondary | ICD-10-CM

## 2013-05-10 MED ORDER — IBUPROFEN 800 MG PO TABS
800.0000 mg | ORAL_TABLET | Freq: Once | ORAL | Status: AC
  Administered 2013-05-10: 800 mg via ORAL
  Filled 2013-05-10: qty 1

## 2013-05-10 MED ORDER — DIAZEPAM 5 MG PO TABS: 5.0000 mg | ORAL_TABLET | Freq: Two times a day (BID) | ORAL | Status: DC

## 2013-05-10 MED ORDER — DIAZEPAM 5 MG PO TABS
5.0000 mg | ORAL_TABLET | Freq: Once | ORAL | Status: AC
  Administered 2013-05-10: 5 mg via ORAL
  Filled 2013-05-10: qty 1

## 2013-05-10 MED ORDER — HYDROCODONE-ACETAMINOPHEN 5-325 MG PO TABS: 2.0000 | ORAL_TABLET | Freq: Four times a day (QID) | ORAL | Status: DC | PRN

## 2013-05-10 MED ORDER — HYDROCODONE-ACETAMINOPHEN 5-325 MG PO TABS
2.0000 | ORAL_TABLET | Freq: Once | ORAL | Status: AC
  Administered 2013-05-10: 2 via ORAL
  Filled 2013-05-10: qty 2

## 2013-05-10 NOTE — ED Notes (Signed)
Pt. Began with rt. Back pain into his rt. Groin at the beginning of the week. Pt. Went to his PCP and Cancer dr.  Quincy Carnes R/O the kidneys and felt it was muscular and placed him on Pain meds.  The pain has moved into his ltl flank area into his lt. Groin area.   Pt. Denies any n/v, has intermittent loose stools due to his radiation that he received.  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

## 2013-05-10 NOTE — ED Provider Notes (Signed)
CSN: [id number]     Arrival date & time 05/10/13  1330 History   First MD Initiated Contact with Patient 05/10/13 1341     Chief Complaint  Patient presents with  . Back Pain   (Consider location/radiation/quality/duration/timing/severity/associated sxs/prior Treatment) HPI Patient presents with back pain.  He states that this episode actually began sometime ago, at least a few weeks, but has become worse over the past week.  The pain was focally about the left lower back with radiation towards the groin, but is now in the mid low back, right low back.  The pain is worse with activity and ambulation. No concurrent hematuria, dysuria, scrotal or penile pain.  No fevers, no chills, no other abdominal pain. Patient endorses prior episodes of similar pain in the distant past. The past few days the patient has been evaluated by his physician's with tests including labs, urinalysis, CT scan.  According to the patient and his daughter, these results have been reassuring. Patient stops taking his pain medication- tramadol, stating that they were of little benefit.   Past Medical History  Diagnosis Date  . Cancer    Past Surgical History  Procedure Laterality Date  . Cholecystectomy    . Abdominal surgery     No family history on file. History  Substance Use Topics  . Smoking status: Current Every Day Smoker -- 0.50 packs/day    Types: Cigarettes  . Smokeless tobacco: Not on file  . Alcohol Use: Yes    Review of Systems  Constitutional:       Per HPI, otherwise negative  HENT:       Per HPI, otherwise negative  Respiratory:       Per HPI, otherwise negative  Cardiovascular:       Per HPI, otherwise negative  Gastrointestinal: Negative for vomiting.  Endocrine:       Negative aside from HPI  Genitourinary: Negative for dysuria, frequency, hematuria, flank pain, discharge, penile swelling, scrotal swelling, enuresis, difficulty urinating, penile pain and testicular pain.   Musculoskeletal:       Per HPI, otherwise negative  Skin: Negative.   Neurological: Negative for syncope.    Allergies  Penicillins  Home Medications   Current Outpatient Rx  Name  Route  Sig  Dispense  Refill  . atorvastatin (LIPITOR) 10 MG tablet   Oral   Take 10 mg by mouth at bedtime.         . Calcium Carbonate-Vitamin D (CALCIUM-VITAMIN D) 500-200 MG-UNIT per tablet   Oral   Take 1 tablet by mouth daily.         Marland Kitchen lisinopril (PRINIVIL,ZESTRIL) 10 MG tablet   Oral   Take 10 mg by mouth daily.         . metFORMIN (GLUCOPHAGE) 1000 MG tablet   Oral   Take 1,000 mg by mouth 2 (two) times daily with a meal.         . diazepam (VALIUM) 5 MG tablet   Oral   Take 1 tablet (5 mg total) by mouth 2 (two) times daily.   10 tablet   0   . HYDROcodone-acetaminophen (NORCO/VICODIN) 5-325 MG per tablet   Oral   Take 2 tablets by mouth every 6 (six) hours as needed for pain.   15 tablet   0    BP 141/64  Pulse 93  Temp(Src) 98.4 F (36.9 C)  Resp 18  Ht 5\' 11"  (1.803 m)  Wt 224 lb (101.606 kg)  BMI  31.26 kg/m2  SpO2 97% Physical Exam  Nursing note and vitals reviewed. Constitutional: He is oriented to person, place, and time. He appears well-developed. No distress.  HENT:  Head: Normocephalic and atraumatic.  Eyes: Conjunctivae and EOM are normal.  Cardiovascular: Normal rate and regular rhythm.   Pulmonary/Chest: Effort normal. No stridor. No respiratory distress.  Abdominal: He exhibits no distension. There is no tenderness. There is no rebound and no guarding.  Musculoskeletal: He exhibits no edema.  Mild tenderness to palpation about the left lower back.  Straight leg test is positive on the left, negative on the right.  Patient is appropriate strength in both hips, knees, ankles.  Neurological: He is alert and oriented to person, place, and time.  Skin: Skin is warm and dry.  Psychiatric: He has a normal mood and affect.    ED Course  Procedures  (including critical care time) Labs Review Labs Reviewed - No data to display Imaging Review No results found.  MDM   1. Back pain    Patient presents with ongoing low back pain.  Notably, the patient has recently evaluated with CT scan, labs, urinalysis, all of which were unremarkable.  Absent any fever, description of new weakness, incontinence, urinary complaints, and with this recently conducted studies, the presentation is likely secondary to musculoskeletal etiology. I spent a considerable amount of time with the patient and his family members discussing the need for further evaluation and management with orthopedists with consideration of possible imaging versus initiation of physical therapy.  In addition, the patient was only taking tramadol for pain relief, and was not compliant with that medication.  He started on a new course of multiple medication for back pain, with the thought of an inflammatory condition.  Discharged in stable condition   Gerhard Munch, MD 05/10/13 1506

## 2013-07-30 ENCOUNTER — Emergency Department (HOSPITAL_COMMUNITY): Payer: Medicare Other

## 2013-07-30 ENCOUNTER — Observation Stay (HOSPITAL_COMMUNITY)
Admission: EM | Admit: 2013-07-30 | Discharge: 2013-07-31 | Disposition: A | Discharge status: Home / Self Care | Payer: Medicare Other | Attending: Internal Medicine | Admitting: Internal Medicine

## 2013-07-30 ENCOUNTER — Encounter (HOSPITAL_COMMUNITY): Payer: Self-pay | Admitting: Emergency Medicine

## 2013-07-30 DIAGNOSIS — E119 Type 2 diabetes mellitus without complications: Secondary | ICD-10-CM | POA: Diagnosis present

## 2013-07-30 DIAGNOSIS — Z8546 Personal history of malignant neoplasm of prostate: Secondary | ICD-10-CM | POA: Insufficient documentation

## 2013-07-30 DIAGNOSIS — Z79899 Other long term (current) drug therapy: Secondary | ICD-10-CM | POA: Insufficient documentation

## 2013-07-30 DIAGNOSIS — Z880 Allergy status to penicillin: Secondary | ICD-10-CM | POA: Insufficient documentation

## 2013-07-30 DIAGNOSIS — E785 Hyperlipidemia, unspecified: Secondary | ICD-10-CM | POA: Insufficient documentation

## 2013-07-30 DIAGNOSIS — R0789 Other chest pain: Principal | ICD-10-CM | POA: Insufficient documentation

## 2013-07-30 DIAGNOSIS — Z923 Personal history of irradiation: Secondary | ICD-10-CM | POA: Insufficient documentation

## 2013-07-30 DIAGNOSIS — I10 Essential (primary) hypertension: Secondary | ICD-10-CM | POA: Diagnosis present

## 2013-07-30 DIAGNOSIS — R110 Nausea: Secondary | ICD-10-CM | POA: Insufficient documentation

## 2013-07-30 DIAGNOSIS — L74519 Primary focal hyperhidrosis, unspecified: Secondary | ICD-10-CM | POA: Insufficient documentation

## 2013-07-30 DIAGNOSIS — F17200 Nicotine dependence, unspecified, uncomplicated: Secondary | ICD-10-CM | POA: Insufficient documentation

## 2013-07-30 DIAGNOSIS — R0602 Shortness of breath: Secondary | ICD-10-CM | POA: Insufficient documentation

## 2013-07-30 DIAGNOSIS — R079 Chest pain, unspecified: Secondary | ICD-10-CM | POA: Diagnosis present

## 2013-07-30 HISTORY — DX: Essential (primary) hypertension: I10

## 2013-07-30 HISTORY — DX: Type 2 diabetes mellitus without complications: E11.9

## 2013-07-30 LAB — COMPREHENSIVE METABOLIC PANEL
AST: 21 U/L (ref 0–37)
CO2: 24 mEq/L (ref 19–32)
Calcium: 9.1 mg/dL (ref 8.4–10.5)
Creatinine, Ser: 1.01 mg/dL (ref 0.50–1.35)
GFR calc non Af Amer: 76 mL/min — ABNORMAL LOW (ref >90)
Sodium: 139 mEq/L (ref 135–145)
Total Protein: 7.4 g/dL (ref 6.0–8.3)

## 2013-07-30 LAB — GLUCOSE, CAPILLARY
Glucose-Capillary: 131 mg/dL — ABNORMAL HIGH (ref 70–99)
Glucose-Capillary: 128 mg/dL — ABNORMAL HIGH (ref 70–99)

## 2013-07-30 LAB — CBC
MCH: 29.2 pg (ref 26.0–34.0)
MCH: 30 pg (ref 26.0–34.0)
MCHC: 33.9 g/dL (ref 30.0–36.0)
MCHC: 34.2 g/dL (ref 30.0–36.0)
MCV: 86.1 fL (ref 78.0–100.0)
MCV: 87.8 fL (ref 78.0–100.0)
Platelets: UNDETERMINED 10*3/uL (ref 150–400)
Platelets: 263 10*3/uL (ref 150–400)
RBC: 4.04 MIL/uL — ABNORMAL LOW (ref 4.22–5.81)
RDW: 14 % (ref 11.5–15.5)
WBC: 4.6 10*3/uL (ref 4.0–10.5)

## 2013-07-30 LAB — POCT I-STAT TROPONIN I

## 2013-07-30 LAB — CREATININE, SERUM: Creatinine, Ser: 1.18 mg/dL (ref 0.50–1.35)

## 2013-07-30 LAB — HEMOGLOBIN A1C: Mean Plasma Glucose: 146 mg/dL — ABNORMAL HIGH (ref <117)

## 2013-07-30 MED ORDER — DEXTROSE 50 % IV SOLN: 25.0000 mL | Freq: Once | INTRAVENOUS | Status: AC | PRN

## 2013-07-30 MED ORDER — ASPIRIN EC 81 MG PO TBEC
81.0000 mg | DELAYED_RELEASE_TABLET | Freq: Every day | ORAL | Status: DC
  Administered 2013-07-31: 81 mg via ORAL
  Filled 2013-07-30: qty 1

## 2013-07-30 MED ORDER — NITROGLYCERIN 0.4 MG SL SUBL
0.4000 mg | SUBLINGUAL_TABLET | SUBLINGUAL | Status: DC | PRN
  Administered 2013-07-30: 0.4 mg via SUBLINGUAL
  Filled 2013-07-30: qty 25

## 2013-07-30 MED ORDER — ONDANSETRON HCL 4 MG/2ML IJ SOLN: 4.0000 mg | Freq: Four times a day (QID) | INTRAMUSCULAR | Status: DC | PRN

## 2013-07-30 MED ORDER — SODIUM CHLORIDE 0.9 % IJ SOLN
3.0000 mL | Freq: Two times a day (BID) | INTRAMUSCULAR | Status: DC
  Administered 2013-07-30 – 2013-07-31 (×2): 3 mL via INTRAVENOUS

## 2013-07-30 MED ORDER — ACETAMINOPHEN 325 MG PO TABS: 650.0000 mg | ORAL_TABLET | Freq: Four times a day (QID) | ORAL | Status: DC | PRN

## 2013-07-30 MED ORDER — ASPIRIN 81 MG PO CHEW
324.0000 mg | CHEWABLE_TABLET | Freq: Once | ORAL | Status: AC
  Administered 2013-07-30: 324 mg via ORAL
  Filled 2013-07-30: qty 4

## 2013-07-30 MED ORDER — DEXTROSE 50 % IV SOLN: 50.0000 mL | Freq: Once | INTRAVENOUS | Status: AC | PRN

## 2013-07-30 MED ORDER — ACETAMINOPHEN 650 MG RE SUPP: 650.0000 mg | Freq: Four times a day (QID) | RECTAL | Status: DC | PRN

## 2013-07-30 MED ORDER — ONDANSETRON HCL 4 MG PO TABS: 4.0000 mg | ORAL_TABLET | Freq: Four times a day (QID) | ORAL | Status: DC | PRN

## 2013-07-30 MED ORDER — LISINOPRIL 10 MG PO TABS
10.0000 mg | ORAL_TABLET | Freq: Every day | ORAL | Status: DC
  Administered 2013-07-30 – 2013-07-31 (×2): 10 mg via ORAL
  Filled 2013-07-30 (×2): qty 1

## 2013-07-30 MED ORDER — INSULIN ASPART 100 UNIT/ML ~~LOC~~ SOLN: 0.0000 [IU] | Freq: Three times a day (TID) | SUBCUTANEOUS | Status: DC

## 2013-07-30 MED ORDER — ENOXAPARIN SODIUM 40 MG/0.4ML ~~LOC~~ SOLN
40.0000 mg | SUBCUTANEOUS | Status: DC
  Filled 2013-07-30 (×2): qty 0.4

## 2013-07-30 MED ORDER — GLUCOSE 40 % PO GEL: 1.0000 | ORAL | Status: DC | PRN

## 2013-07-30 MED ORDER — ATORVASTATIN CALCIUM 10 MG PO TABS
10.0000 mg | ORAL_TABLET | Freq: Every day | ORAL | Status: DC
  Administered 2013-07-30: 10 mg via ORAL
  Filled 2013-07-30 (×2): qty 1

## 2013-07-30 NOTE — ED Notes (Signed)
Pt reports he took 81 mg of ASA at home PTA.

## 2013-07-30 NOTE — ED Notes (Signed)
Pt returned from radiology.

## 2013-07-30 NOTE — ED Provider Notes (Signed)
TIME SEEN: 8:49 AM  CHIEF COMPLAINT: Chest pain, shortness of breath, nausea and diaphoresis  HPI: Patient is a 67 year old African American male with a history of hypertension, non-insulin-dependent diabetes, hyperlipidemia, prior history of tobacco use, prostate cancer status post radiation who presents the emergency department with left-sided chest pressure without radiation that was moderate in severity and that occurred when cleaning his kitchen this morning. He states it lasted for approximately 10 minutes it's most severe form and has improved with rest but has not completely gone. He had associated shortness of breath and felt like he needed to "belch". He also had nausea and some diaphoresis. No vomiting or lightheadedness. No lower extremity swelling or pain. No prior history of PE or DVT. No fever or cough. Denies a prior history of stress test, cardiac catheterization, MI.  ROS: See HPI Constitutional: no fever  Eyes: no drainage  ENT: no runny nose   Cardiovascular:   chest pain  Resp:  SOB  GI: no vomiting GU: no dysuria Integumentary: no rash  Allergy: no hives  Musculoskeletal: no leg swelling  Neurological: no slurred speech ROS otherwise negative  PAST MEDICAL HISTORY/PAST SURGICAL HISTORY:  Past Medical History  Diagnosis Date  . Cancer   . Diabetes mellitus without complication   . Hypertension     MEDICATIONS:  Prior to Admission medications   Medication Sig Start Date End Date Taking? Authorizing Provider  atorvastatin (LIPITOR) 10 MG tablet Take 10 mg by mouth at bedtime.    Historical Provider, MD  Calcium Carbonate-Vitamin D (CALCIUM-VITAMIN D) 500-200 MG-UNIT per tablet Take 1 tablet by mouth daily.    Historical Provider, MD  diazepam (VALIUM) 5 MG tablet Take 1 tablet (5 mg total) by mouth 2 (two) times daily. 05/10/13   Gerhard Munch, MD  HYDROcodone-acetaminophen (NORCO/VICODIN) 5-325 MG per tablet Take 2 tablets by mouth every 6 (six) hours as needed  for pain. 05/10/13   Gerhard Munch, MD  lisinopril (PRINIVIL,ZESTRIL) 10 MG tablet Take 10 mg by mouth daily.    Historical Provider, MD  metFORMIN (GLUCOPHAGE) 1000 MG tablet Take 1,000 mg by mouth 2 (two) times daily with a meal.    Historical Provider, MD    ALLERGIES:  Allergies  Allergen Reactions  . Penicillins Hives    SOCIAL HISTORY:  History  Substance Use Topics  . Smoking status: Current Every Day Smoker -- 0.50 packs/day    Types: Cigarettes  . Smokeless tobacco: Not on file  . Alcohol Use: Yes    FAMILY HISTORY: No family history on file.  EXAM: BP 178/83  Pulse 74  Temp(Src) 98.2 F (36.8 C)  Resp 20  SpO2 99% CONSTITUTIONAL: Alert and oriented and responds appropriately to questions. Well-appearing; well-nourished HEAD: Normocephalic EYES: Conjunctivae clear, PERRL ENT: normal nose; no rhinorrhea; moist mucous membranes; pharynx without lesions noted NECK: Supple, no meningismus, no LAD  CARD: RRR; S1 and S2 appreciated; no murmurs, no clicks, no rubs, no gallops RESP: Normal chest excursion without splinting or tachypnea; breath sounds clear and equal bilaterally; no wheezes, no rhonchi, no rales,  ABD/GI: Normal bowel sounds; non-distended; soft, non-tender, no rebound, no guarding BACK:  The back appears normal and is non-tender to palpation, there is no CVA tenderness EXT: Normal ROM in all joints; non-tender to palpation; no edema; normal capillary refill; no cyanosis    SKIN: Normal color for age and race; warm NEURO: Moves all extremities equally PSYCH: The patient's mood and manner are appropriate. Grooming and personal hygiene are  appropriate.  MEDICAL DECISION MAKING: Patient with multiple risk factors for ACS with concerning story. EKG shows no ST changes. He is hemodynamically stable. Will give aspirin, nitroglycerin. Will obtain cardiac labs, chest x-ray. Patient will need admission. He does not have a cardiologist. His primary care physician  is at [facility].  ED PROGRESS: Labs unremarkable.  Troponin negative.  Chest x-ray clear. Patient's chest pain is now completely gone after one nitroglycerin tablet. He is still hemodynamically stable. Discussed with hospitalist for admission.  Patient agrees with plan.    EKG Interpretation    Date/Time:  Wednesday July 30 2013 08:41:23 EST Ventricular Rate:  76 PR Interval:  140 QRS Duration: 84 QT Interval:  406 QTC Calculation: 456 R Axis:   69 Text Interpretation:  Normal sinus rhythm Normal ECG Confirmed by WARD  DO, KRISTEN ([id number]) on 07/30/2013 8:48:21 AM              Layla Maw Ward, DO 07/30/13 1124

## 2013-07-30 NOTE — H&P (Signed)
[facility] History and Physical  Antjuan Rothe [place] ZOX:[id number] DOB: [birth year] DOA: 07/30/2013  Referring physician:  PCP: Willow Ora, MD  Specialists:   Chief Complaint: chest pain   HPI: Danny Mccall is a 67 y.o. male with PMH of DM, HTN, HPL presented with non exertional non radiating L sided chest pain, associated with diaphoresis; symptoms resolved after NTG; denies exertional symptoms, no cough, no dizziness, no fever;   Review of Systems: The patient denies anorexia, fever, weight loss,, vision loss, decreased hearing, hoarseness, syncope, dyspnea on exertion, peripheral edema, balance deficits, hemoptysis, abdominal pain, melena, hematochezia, severe indigestion/heartburn, hematuria, incontinence, genital sores, muscle weakness, suspicious skin lesions, transient blindness, difficulty walking, depression, unusual weight change, abnormal bleeding, enlarged lymph nodes, angioedema, and breast masses.    Past Medical History  Diagnosis Date  . Cancer   . Diabetes mellitus without complication   . Hypertension    Past Surgical History  Procedure Laterality Date  . Cholecystectomy    . Abdominal surgery     Social History:  reports that he has been smoking Cigarettes.  He has been smoking about 0.50 packs per day. He does not have any smokeless tobacco history on file. He reports that he drinks alcohol. He reports that he does not use illicit drugs. Home:  where does patient live--home, ALF, SNF? and with whom if at home? Yes:  Can patient participate in ADLs?  Allergies  Allergen Reactions  . Penicillins Hives    No family history on file.  Denies CVA (be sure to complete)  Prior to Admission medications   Medication Sig Start Date End Date Taking? Authorizing Provider  aspirin EC 81 MG tablet Take 81 mg by mouth daily.   Yes Historical Provider, MD  atorvastatin (LIPITOR) 10 MG tablet Take 10 mg by mouth at bedtime.   Yes Historical Provider,  MD  Calcium Carbonate-Vitamin D (CALCIUM-VITAMIN D) 500-200 MG-UNIT per tablet Take 1 tablet by mouth daily.   Yes Historical Provider, MD  lisinopril (PRINIVIL,ZESTRIL) 10 MG tablet Take 10 mg by mouth daily.   Yes Historical Provider, MD  metFORMIN (GLUCOPHAGE) 1000 MG tablet Take 1,000 mg by mouth 2 (two) times daily with a meal.   Yes Historical Provider, MD   Physical Exam: Filed Vitals:   07/30/13 1155  BP: 148/87  Pulse: 72  Temp: 98.3 F (36.8 C)  Resp: 20     General:  alert  Eyes: eom-i  ENT: no oral ulcers  Neck: supp,e  Cardiovascular: s1,s2 rrr  Respiratory: CTA BL  Abdomen: soft, nt, nd  Skin: no rash   Musculoskeletal: no LE edema   Psychiatric: no hallucinations   Neurologic: CN 2-12 intact, motor 5/5 symmetric   Labs on Admission:  Basic Metabolic Panel:  Recent Labs Lab 07/30/13 0911  NA 139  K 4.7  CL 103  CO2 24  GLUCOSE 167*  BUN 9  CREATININE 1.01  CALCIUM 9.1   Liver Function Tests:  Recent Labs Lab 07/30/13 0911  AST 21  ALT 14  ALKPHOS 54  BILITOT 0.3  PROT 7.4  ALBUMIN 3.7   No results found for this basename: LIPASE, AMYLASE,  in the last 168 hours No results found for this basename: AMMONIA,  in the last 168 hours CBC:  Recent Labs Lab 07/30/13 0911  WBC 4.5  HGB 11.8*  HCT 34.8*  MCV 86.1  PLT PLATELET CLUMPS NOTED ON SMEAR, UNABLE TO ESTIMATE   Cardiac Enzymes: No results found for this  basename: CKTOTAL, CKMB, CKMBINDEX, TROPONINI,  in the last 168 hours  BNP (last 3 results) No results found for this basename: PROBNP,  in the last 8760 hours CBG: No results found for this basename: GLUCAP,  in the last 168 hours  Radiological Exams on Admission: Dg Chest 2 View  07/30/2013   CLINICAL DATA:  Chest pain, shortness of breath, dizzy  EXAM: CHEST  2 VIEW  COMPARISON:  08/18/2009  FINDINGS: The heart size and mediastinal contours are within normal limits. Both lungs are clear. The visualized skeletal  structures are unremarkable.  IMPRESSION: No active cardiopulmonary disease.   Electronically Signed   By: Esperanza Heir M.D.   On: 07/30/2013 09:31    EKG: Independently reviewed. NSR, no acute ST/T changes   Assessment/Plan Principal Problem:   Chest pain Active Problems:   HTN (hypertension)   Diabetes  67 y.o. male with PMH of DM, HTN, HPL presented with chest pain  1. Chest pain atypical, but significant risk factors for CAD; ECG, initial trop unremarkable; pain resolved -tele/obs; serial ECG, trop; c/s cardiology; likely need stress test   2. HTN not at goal; resume home meds; titrate per response  3. DM no recent HA1C; hold PO meds; ISS; check HA1C;   4. HPL; resume statin    Cardiology;  if consultant consulted, please document name and whether formally or informally consulted  Code Status: full (must indicate code status--if unknown or must be presumed, indicate so) Family Communication: none at theh bedside (indicate person spoken with, if applicable, with phone number if by telephone) Disposition Plan: home likely 24-48 hours  (indicate anticipated LOS)  Time spent: > 45 minutes   Esperanza Sheets [facility] Pager [phone]  If 7PM-7AM, please contact night-coverage [email or web address] Password [facility] 07/30/2013, 12:47 PM

## 2013-07-30 NOTE — ED Notes (Signed)
Pt undressed, in gown, on monitor, continuous pulse oximetry and blood pressure cuff; warm blanket given 

## 2013-07-30 NOTE — Consult Note (Signed)
CARDIOLOGY CONSULT NOTE       Patient ID: Danny Mccall MRN: [id number] DOB/AGE: [birth year] 67 y.o.  Admit date: 07/30/2013 Referring Physician:  York Spaniel Primary Physician: Willow Ora, MD Primary Cardiologist:  new Reason for Consultation: Chest Pain  Principal Problem:   Chest pain Active Problems:   HTN (hypertension)   Diabetes   HPI:   67 yo diabetic with HTN.  Admitted with SSCP  Had left sided chest pain radiating to arm yesterday afternoon.  No history of CAD.  Pain not pleuritic or positional Thought It might be gas but no relief with belching. In ER had relief with nitro.  No history of GERD Ulcer or gastroparesis. Has not had recent stress test Just changed primary care doctors and  Not clear that BS well controlled Says he takes his BP meds. Has prostate cancer and just finished radiation RX  No dypsnea, syncope or palpitaitons. Had waxing / waning pains here In hospital but none now.  ECG with no acute changes and troponin negative  ROS All other systems reviewed and negative except as noted above  Past Medical History  Diagnosis Date  . Cancer   . Diabetes mellitus without complication   . Hypertension     No family history on file.  History   Social History  . Marital Status: Married    Spouse Name: N/A    Number of Children: N/A  . Years of Education: N/A   Occupational History  . Not on file.   Social History Main Topics  . Smoking status: Current Every Day Smoker -- 0.50 packs/day    Types: Cigarettes  . Smokeless tobacco: Not on file  . Alcohol Use: Yes  . Drug Use: No  . Sexual Activity: Not on file   Other Topics Concern  . Not on file   Social History Narrative  . No narrative on file    Past Surgical History  Procedure Laterality Date  . Cholecystectomy    . Abdominal surgery       . aspirin EC  81 mg Oral Daily  . atorvastatin  10 mg Oral QHS  . enoxaparin (LOVENOX) injection  40 mg Subcutaneous Q24H  .  lisinopril  10 mg Oral Daily  . sodium chloride  3 mL Intravenous Q12H      Physical Exam: Blood pressure 148/87, pulse 72, temperature 98.3 F (36.8 C), resp. rate 20, height 5\' 11"  (1.803 m), weight 220 lb (99.791 kg), SpO2 100.00%.  Affect appropriate Healthy:  appears stated age HEENT: normal Neck supple with no adenopathy JVP normal no bruits no thyromegaly Lungs clear with no wheezing and good diaphragmatic motion Heart:  S1/S2 no murmur, no rub, gallop or click PMI normal Abdomen: benighn, BS positve, no tenderness, no AAA no bruit.  No HSM or HJR Distal pulses intact with no bruits No edema Neuro non-focal Skin warm and dry No muscular weakness   Labs:   Lab Results  Component Value Date   WBC 4.5 07/30/2013   HGB 11.8* 07/30/2013   HCT 34.8* 07/30/2013   MCV 86.1 07/30/2013   PLT PLATELET CLUMPS NOTED ON SMEAR, UNABLE TO ESTIMATE 07/30/2013    Recent Labs Lab 07/30/13 0911  NA 139  K 4.7  CL 103  CO2 24  BUN 9  CREATININE 1.01  CALCIUM 9.1  PROT 7.4  BILITOT 0.3  ALKPHOS 54  ALT 14  AST 21  GLUCOSE 167*   Lab Results  Component Value Date  CKTOTAL 81 08/02/2009   CKMB 1.4 08/02/2009   TROPONINI  Value: 0.03        NO INDICATION OF MYOCARDIAL INJURY. 08/02/2009     Radiology: Dg Chest 2 View  07/30/2013   CLINICAL DATA:  Chest pain, shortness of breath, dizzy  EXAM: CHEST  2 VIEW  COMPARISON:  08/18/2009  FINDINGS: The heart size and mediastinal contours are within normal limits. Both lungs are clear. The visualized skeletal structures are unremarkable.  IMPRESSION: No active cardiopulmonary disease.   Electronically Signed   By: Esperanza Heir M.D.   On: 07/30/2013 09:31    EKG:  NSR normal No acute changes   ASSESSMENT AND PLAN:  Chest Pain: Some atypical features. But diabetic with HTN and pain relieved with nitro. Discussed options with patient. Will schedule cardiac CT for 8:00 a.m.  No dye allergy and already on beta blocker HTN:   Continue ACE since he has DM DM:  Discussed low carb diet.  Target hemoglobin A1c is 6.5 or less.  Continue current medications. Prostate CA:  F/u urology No PSA on chart I believe he is followed at [facility]   Signed: Charlton Haws 07/30/2013, 3:31 PM

## 2013-07-30 NOTE — ED Notes (Signed)
Left Cp started 25 mins ago some sob  Some nausea

## 2013-07-30 NOTE — ED Notes (Signed)
Pt reports he started sweating then noticed he was having CP and became SOB, sts the pain started while he was dumping an ash tray out then wiped down a table. Reports the pain has eased off a lot now. Nad, skin warm and dry, resp e/u

## 2013-07-31 ENCOUNTER — Observation Stay (HOSPITAL_COMMUNITY): Payer: Medicare Other

## 2013-07-31 DIAGNOSIS — E119 Type 2 diabetes mellitus without complications: Secondary | ICD-10-CM

## 2013-07-31 DIAGNOSIS — I10 Essential (primary) hypertension: Secondary | ICD-10-CM

## 2013-07-31 DIAGNOSIS — R079 Chest pain, unspecified: Secondary | ICD-10-CM

## 2013-07-31 LAB — GLUCOSE, CAPILLARY
Glucose-Capillary: 141 mg/dL — ABNORMAL HIGH (ref 70–99)
Glucose-Capillary: 141 mg/dL — ABNORMAL HIGH (ref 70–99)

## 2013-07-31 MED ORDER — METOPROLOL TARTRATE 1 MG/ML IV SOLN
INTRAVENOUS | Status: AC
  Filled 2013-07-31: qty 5

## 2013-07-31 MED ORDER — METOPROLOL TARTRATE 1 MG/ML IV SOLN
5.0000 mg | INTRAVENOUS | Status: AC
  Administered 2013-07-31: 5 mg via INTRAVENOUS
  Filled 2013-07-31: qty 5

## 2013-07-31 MED ORDER — NITROGLYCERIN 0.4 MG SL SUBL
SUBLINGUAL_TABLET | SUBLINGUAL | Status: AC
  Administered 2013-07-31: 0.4 mg via SUBLINGUAL
  Filled 2013-07-31: qty 25

## 2013-07-31 MED ORDER — METFORMIN HCL 1000 MG PO TABS: 1000.0000 mg | ORAL_TABLET | Freq: Two times a day (BID) | ORAL | Status: DC

## 2013-07-31 MED ORDER — METOPROLOL TARTRATE 25 MG PO TABS
25.0000 mg | ORAL_TABLET | ORAL | Status: AC
  Administered 2013-07-31: 25 mg via ORAL
  Filled 2013-07-31: qty 1

## 2013-07-31 MED ORDER — IOHEXOL 350 MG/ML SOLN
80.0000 mL | Freq: Once | INTRAVENOUS | Status: AC | PRN
  Administered 2013-07-31: 80 mL via INTRAVENOUS

## 2013-07-31 NOTE — Progress Notes (Signed)
Patient ID: Danny Mccall, male   DOB: 04/27/1946, 67 y.o.   MRN: [id number]    Subjective:  Denies SSCP, palpitations or Dyspnea   Objective:  Filed Vitals:   07/30/13 1130 07/30/13 1155 07/30/13 2214 07/31/13 0516  BP: 147/93 148/87 147/80 140/78  Pulse: 71 72 71 68  Temp:  98.3 F (36.8 C) 98.4 F (36.9 C) 97.8 F (36.6 C)  TempSrc:   Oral Oral  Resp: 21 20 18 18   Height:  5\' 11"  (1.803 m)    Weight:  220 lb (99.791 kg)    SpO2: 100% 100% 100% 100%    Intake/Output from previous day: No intake or output data in the 24 hours ending 07/31/13 0756  Physical Exam: Affect appropriate Healthy:  appears stated age HEENT: normal Neck supple with no adenopathy JVP normal no bruits no thyromegaly Lungs clear with no wheezing and good diaphragmatic motion Heart:  S1/S2 no murmur, no rub, gallop or click PMI normal Abdomen: benighn, BS positve, no tenderness, no AAA no bruit.  No HSM or HJR Distal pulses intact with no bruits No edema Neuro non-focal Skin warm and dry No muscular weakness   Lab Results: Basic Metabolic Panel:  Recent Labs  21/30/86 0911 07/30/13 1330  NA 139  --   K 4.7  --   CL 103  --   CO2 24  --   GLUCOSE 167*  --   BUN 9  --   CREATININE 1.01 1.18  CALCIUM 9.1  --    Liver Function Tests:  Recent Labs  07/30/13 0911  AST 21  ALT 14  ALKPHOS 54  BILITOT 0.3  PROT 7.4  ALBUMIN 3.7   CBC:  Recent Labs  07/30/13 0911 07/30/13 1330  WBC 4.5 4.6  HGB 11.8* 12.0*  HCT 34.8* 35.1*  MCV 86.1 87.8  PLT PLATELET CLUMPS NOTED ON SMEAR, UNABLE TO ESTIMATE 263   Cardiac Enzymes:  Recent Labs  07/30/13 1252 07/30/13 1940 07/31/13 0001  TROPONINI <0.30 <0.30 <0.30   Hemoglobin A1C:  Recent Labs  07/30/13 1330  HGBA1C 6.7*    Imaging: Dg Chest 2 View  07/30/2013   CLINICAL DATA:  Chest pain, shortness of breath, dizzy  EXAM: CHEST  2 VIEW  COMPARISON:  08/18/2009  FINDINGS: The heart size and mediastinal  contours are within normal limits. Both lungs are clear. The visualized skeletal structures are unremarkable.  IMPRESSION: No active cardiopulmonary disease.   Electronically Signed   By: Esperanza Heir M.D.   On: 07/30/2013 09:31    Cardiac Studies:  ECG:  NSR normal no acute changes isolated  T wave inversion lead 3   Telemetry:  NSR no arrhythmia  Echo:   Medications:   . aspirin EC  81 mg Oral Daily  . atorvastatin  10 mg Oral QHS  . enoxaparin (LOVENOX) injection  40 mg Subcutaneous Q24H  . insulin aspart  0-15 Units Subcutaneous TID WC  . lisinopril  10 mg Oral Daily  . metoprolol  5 mg Intravenous NOW  . nitroGLYCERIN      . sodium chloride  3 mL Intravenous Q12H       Assessment/Plan:  Chest Pain:  Diabetic Relieved with nitro R/O no ECG changes cardiac CT this am HTN:  Continue ACE DM:  A1c a bit high low carb diet  Was on metformin as outpatient   D/C home today if CT ok  Charlton Haws 07/31/2013, 7:56 AM

## 2013-07-31 NOTE — Discharge Summary (Signed)
Physician Discharge Summary  Tylek Boney [place] ZOX:[id number] DOB: 10-May-1946 DOA: 07/30/2013  PCP: Willow Ora, MD  Admit date: 07/30/2013 Discharge date: 07/31/2013  Time spent: Less than 30 minutes  Recommendations for Outpatient Follow-up:  1. Dr. Asencion Partridge, PCP on 08/12/13 at 2:30 PM.  Discharge Diagnoses:  Principal Problem:   Chest pain Active Problems:   HTN (hypertension)   Diabetes   Discharge Condition: Improved & Stable  Diet recommendation: Heart Healthy & Diabetic diet.  Filed Weights   07/30/13 1155  Weight: 99.791 kg (220 lb)    History of present illness & hospital course:  46 M with h/o DM, HTN, HL was admitted on 11/19 with substernal chest pain which began at 8:30 AM. CP was non exertional, non radiating, non pleuritic, but associated with brief diaphoresis and relieved with NTG. Admitted for ACS rule out. EKG without acute changes and troponin's were negative. His CP had resolved. Cardiology was consulted. Although CP was not typical, given DM, HTN and CP relieved with NTG, they performed Cardiac CT which showed no obstructive CAD. Cardiology cleared patient for DC. He was counseled regarding good control of DM & HTN. Continue ASA, statins & ACEI. Metformin held for 48 hours due to contrasted CT. Mild Anemia stable and for OP evaluation & FU.  Consultations:  Cardiology  Procedures:  None   Discharge Exam:  Complaints:  No further CP since ED  Filed Vitals:   07/31/13 0815 07/31/13 0817 07/31/13 0820 07/31/13 0825  BP:      Pulse: 64 61 62 59  Temp:      TempSrc:      Resp:      Height:      Weight:      SpO2:       temperature 97.35F, pulse 59 per minute, respiration 18 per minute, blood pressure 140/78 mmHg, oxygen saturation 100%.  General exam: Moderately built and overweight male, sitting comfortably on chair seen having breakfast this morning Respiratory system: Clear. No increased work of breathing. Cardiovascular system:  S1 & S2 heard, RRR. No JVD, murmurs, gallops, clicks or pedal edema. Telemetry: Sinus rhythm Gastrointestinal system: Abdomen is nondistended, soft and nontender. Normal bowel sounds heard. Central nervous system: Alert and oriented. No focal neurological deficits. Extremities: Symmetric 5 x 5 power.  Discharge Instructions      Discharge Orders   Future Orders Complete By Expires   Call MD for:  severe uncontrolled pain  As directed    Diet - low sodium heart healthy  As directed    Diet Carb Modified  As directed    Increase activity slowly  As directed        Medication List         aspirin EC 81 MG tablet  Take 81 mg by mouth daily.     atorvastatin 10 MG tablet  Commonly known as:  LIPITOR  Take 10 mg by mouth at bedtime.     calcium-vitamin D 500-200 MG-UNIT per tablet  Take 1 tablet by mouth daily.     lisinopril 10 MG tablet  Commonly known as:  PRINIVIL,ZESTRIL  Take 10 mg by mouth daily.     metFORMIN 1000 MG tablet  Commonly known as:  GLUCOPHAGE  Take 1 tablet (1,000 mg total) by mouth 2 (two) times daily with a meal. Due to contrast that you received during CT scan on 07/31/13, do not take metformin for 2 days. May restart on 08/02/13.  Follow-up Information   Follow up with ANDY,CAMILLE L, MD. Schedule an appointment as soon as possible for a visit in 1 week.   Specialty:  Family Medicine   Contact information:   [address] [place] [address] [phone]        The results of significant diagnostics from this hospitalization (including imaging, microbiology, ancillary and laboratory) are listed below for reference.    Significant Diagnostic Studies: Dg Chest 2 View  07/30/2013   CLINICAL DATA:  Chest pain, shortness of breath, dizzy  EXAM: CHEST  2 VIEW  COMPARISON:  08/18/2009  FINDINGS: The heart size and mediastinal contours are within normal limits. Both lungs are clear. The visualized skeletal structures are  unremarkable.  IMPRESSION: No active cardiopulmonary disease.   Electronically Signed   By: Esperanza Heir M.D.   On: 07/30/2013 09:31   Ct Coronary Morp W/cta Cor W/score W/ca W/cm &/or Wo/cm  07/31/2013   ADDENDUM REPORT: 07/31/2013 09:16  CLINICAL DATA:  Chest pain  EXAM: Cardiac CTA  MEDICATIONS: Sub lingual nitro. 4mg  and lopressor 5mg   TECHNIQUE: The patient was scanned on a Philips 256 slice scanner. Gantry rotation speed was 270 msecs. Collimation was .9mm. A 100 kV prospective scan was triggered in the descending thoracic aorta at 111 HU's with 5% padding centered around 78% of the R-R interval. Average HR during the scan was 68 bpm. The 3D data set was interpreted on a dedicated work station using MPR, MIP and VRT modes. A total of 80cc of contrast was used.  FINDINGS: Non-cardiac: See separate report from [facility]. No significant findings on limited lung and soft tissue windows.  Calcium Score:  1.6 isolated foci in mid LAD  Aorta: normal  Sinsus Valsalve 3.6 cm  Sinotubular junction:  2.9 cm  Ascending Aorta:  3.1 cm  Coronary Arteries: Right dominant with no anomalies  LM: Normal  LAD:  Normal  IM:  Large branching vessel normal  D1: Normal  D2: Normal  Circumflex: Normal  OM1: Normal  RCA:  Dominant and normal  PDA: Normal  PLA:  Normal  IMPRESSION: 1) Calcium score 1.6 17th percentile for age and sex matched controls Isolated foci in mid LAD  2) No obstructive coronary artery disease  3) Normal ascending aortic root  4) No significant non cardiac findings  Charlton Haws   Electronically Signed   By: Charlton Haws M.D.   On: 07/31/2013 09:16   07/31/2013   EXAM: OVER-READ INTERPRETATION  CT CHEST  The following report is an over-read performed by radiologist Dr. Royal Piedra [facility] on 07/31/2013. This over-read does not include interpretation of cardiac or coronary anatomy or pathology. The interpretation by the cardiologist is attached.  COMPARISON:  Chest CT  08/02/2009.  FINDINGS: Within the visualized portions of the thorax there is no acute consolidative airspace disease, no pleural effusions and no suspicious appearing pulmonary nodules or masses. Mild centrilobular emphysematous changes are noted. Visualized portions of the upper abdomen are unremarkable. There are no aggressive appearing lytic or blastic lesions noted in the visualized portions of the skeleton.  IMPRESSION: 1. No acute incidental noncardiac findings. 2. Mild centrilobular emphysema.  Electronically Signed: By: Trudie Reed M.D. On: 07/31/2013 08:43    Microbiology: No results found for this or any previous visit (from the past 240 hour(s)).   Labs: Basic Metabolic Panel:  Recent Labs Lab 07/30/13 0911 07/30/13 1330  NA 139  --   K 4.7  --  CL 103  --   CO2 24  --   GLUCOSE 167*  --   BUN 9  --   CREATININE 1.01 1.18  CALCIUM 9.1  --    Liver Function Tests:  Recent Labs Lab 07/30/13 0911  AST 21  ALT 14  ALKPHOS 54  BILITOT 0.3  PROT 7.4  ALBUMIN 3.7   No results found for this basename: LIPASE, AMYLASE,  in the last 168 hours No results found for this basename: AMMONIA,  in the last 168 hours CBC:  Recent Labs Lab 07/30/13 0911 07/30/13 1330  WBC 4.5 4.6  HGB 11.8* 12.0*  HCT 34.8* 35.1*  MCV 86.1 87.8  PLT PLATELET CLUMPS NOTED ON SMEAR, UNABLE TO ESTIMATE 263   Cardiac Enzymes:  Recent Labs Lab 07/30/13 1252 07/30/13 1940 07/31/13 0001  TROPONINI <0.30 <0.30 <0.30   BNP: BNP (last 3 results) No results found for this basename: PROBNP,  in the last 8760 hours CBG:  Recent Labs Lab 07/30/13 1700 07/30/13 2210 07/31/13 0757  GLUCAP 131* 128* 141*    Additional labs: 1. HbA1C: 6.7    Signed:  Marcellus Scott, MD, FACP, FHM. [facility] Pager [phone]  If 7PM-7AM, please contact night-coverage [email or web address] Password [email or web address] 07/31/2013, 11:22 AM

## 2013-07-31 NOTE — Progress Notes (Signed)
Reviewed discharge instructions with patient and he stated his understanding.  Followup appointment made with primary MD for August 12, 2013 at 1430, written on discharge instructions.  Discharged home by himself.  Colman Cater

## 2014-03-23 ENCOUNTER — Emergency Department (HOSPITAL_COMMUNITY)
Admission: EM | Admit: 2014-03-23 | Discharge: 2014-03-23 | Disposition: A | Discharge status: Home / Self Care | Payer: No Typology Code available for payment source | Attending: Emergency Medicine | Admitting: Emergency Medicine

## 2014-03-23 ENCOUNTER — Encounter (HOSPITAL_COMMUNITY): Payer: Self-pay | Admitting: Emergency Medicine

## 2014-03-23 DIAGNOSIS — Y92410 Unspecified street and highway as the place of occurrence of the external cause: Secondary | ICD-10-CM | POA: Insufficient documentation

## 2014-03-23 DIAGNOSIS — Y9389 Activity, other specified: Secondary | ICD-10-CM | POA: Insufficient documentation

## 2014-03-23 DIAGNOSIS — Z859 Personal history of malignant neoplasm, unspecified: Secondary | ICD-10-CM | POA: Insufficient documentation

## 2014-03-23 DIAGNOSIS — S0990XA Unspecified injury of head, initial encounter: Secondary | ICD-10-CM | POA: Insufficient documentation

## 2014-03-23 DIAGNOSIS — S335XXA Sprain of ligaments of lumbar spine, initial encounter: Secondary | ICD-10-CM | POA: Insufficient documentation

## 2014-03-23 DIAGNOSIS — Z7982 Long term (current) use of aspirin: Secondary | ICD-10-CM | POA: Insufficient documentation

## 2014-03-23 DIAGNOSIS — S139XXA Sprain of joints and ligaments of unspecified parts of neck, initial encounter: Secondary | ICD-10-CM | POA: Insufficient documentation

## 2014-03-23 DIAGNOSIS — E119 Type 2 diabetes mellitus without complications: Secondary | ICD-10-CM | POA: Insufficient documentation

## 2014-03-23 DIAGNOSIS — S161XXA Strain of muscle, fascia and tendon at neck level, initial encounter: Secondary | ICD-10-CM

## 2014-03-23 DIAGNOSIS — Z79899 Other long term (current) drug therapy: Secondary | ICD-10-CM | POA: Insufficient documentation

## 2014-03-23 DIAGNOSIS — S39012A Strain of muscle, fascia and tendon of lower back, initial encounter: Secondary | ICD-10-CM

## 2014-03-23 DIAGNOSIS — Z880 Allergy status to penicillin: Secondary | ICD-10-CM | POA: Insufficient documentation

## 2014-03-23 DIAGNOSIS — I10 Essential (primary) hypertension: Secondary | ICD-10-CM | POA: Insufficient documentation

## 2014-03-23 DIAGNOSIS — F17200 Nicotine dependence, unspecified, uncomplicated: Secondary | ICD-10-CM | POA: Insufficient documentation

## 2014-03-23 MED ORDER — HYDROCODONE-ACETAMINOPHEN 5-325 MG PO TABS: 1.0000 | ORAL_TABLET | Freq: Four times a day (QID) | ORAL | Status: DC | PRN

## 2014-03-23 MED ORDER — OXYCODONE-ACETAMINOPHEN 5-325 MG PO TABS
1.0000 | ORAL_TABLET | Freq: Once | ORAL | Status: AC
  Administered 2014-03-23: 1 via ORAL
  Filled 2014-03-23: qty 1

## 2014-03-23 NOTE — Discharge Instructions (Signed)

## 2014-03-23 NOTE — ED Notes (Signed)
Declined W/C at D/C and was escorted to lobby by RN. 
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
## 2014-03-25 NOTE — ED Provider Notes (Signed)
Medical screening examination/treatment/procedure(s) were performed by non-physician practitioner and as supervising physician I was immediately available for consultation/collaboration.   EKG Interpretation None        Varney Biles, MD 03/25/14 1232

## 2014-04-07 ENCOUNTER — Ambulatory Visit: Payer: Medicare Other | Attending: Family Medicine

## 2014-04-07 DIAGNOSIS — M545 Low back pain, unspecified: Secondary | ICD-10-CM | POA: Diagnosis not present

## 2014-04-07 DIAGNOSIS — M25659 Stiffness of unspecified hip, not elsewhere classified: Secondary | ICD-10-CM | POA: Insufficient documentation

## 2014-04-07 DIAGNOSIS — IMO0001 Reserved for inherently not codable concepts without codable children: Secondary | ICD-10-CM | POA: Diagnosis present

## 2014-04-07 DIAGNOSIS — R5381 Other malaise: Secondary | ICD-10-CM | POA: Insufficient documentation

## 2014-04-07 DIAGNOSIS — I10 Essential (primary) hypertension: Secondary | ICD-10-CM | POA: Insufficient documentation

## 2014-04-13 ENCOUNTER — Ambulatory Visit: Payer: No Typology Code available for payment source | Admitting: Physical Therapy

## 2014-04-15 ENCOUNTER — Ambulatory Visit: Payer: No Typology Code available for payment source | Attending: Family Medicine | Admitting: Physical Therapy

## 2014-04-15 DIAGNOSIS — IMO0001 Reserved for inherently not codable concepts without codable children: Secondary | ICD-10-CM | POA: Diagnosis present

## 2014-04-15 DIAGNOSIS — R5381 Other malaise: Secondary | ICD-10-CM | POA: Diagnosis not present

## 2014-04-15 DIAGNOSIS — I10 Essential (primary) hypertension: Secondary | ICD-10-CM | POA: Diagnosis not present

## 2014-04-15 DIAGNOSIS — M545 Low back pain, unspecified: Secondary | ICD-10-CM | POA: Diagnosis not present

## 2014-04-15 DIAGNOSIS — M25659 Stiffness of unspecified hip, not elsewhere classified: Secondary | ICD-10-CM | POA: Diagnosis not present

## 2014-04-17 ENCOUNTER — Ambulatory Visit: Payer: No Typology Code available for payment source

## 2014-04-17 DIAGNOSIS — IMO0001 Reserved for inherently not codable concepts without codable children: Secondary | ICD-10-CM | POA: Diagnosis not present

## 2014-04-20 ENCOUNTER — Ambulatory Visit: Payer: No Typology Code available for payment source | Admitting: Physical Therapy

## 2014-04-20 DIAGNOSIS — IMO0001 Reserved for inherently not codable concepts without codable children: Secondary | ICD-10-CM | POA: Diagnosis not present

## 2014-04-22 ENCOUNTER — Ambulatory Visit: Payer: No Typology Code available for payment source | Admitting: Physical Therapy

## 2014-04-24 ENCOUNTER — Ambulatory Visit: Payer: No Typology Code available for payment source

## 2014-04-27 ENCOUNTER — Encounter: Payer: Medicare Other | Admitting: Physical Therapy

## 2014-04-29 ENCOUNTER — Ambulatory Visit: Payer: No Typology Code available for payment source | Admitting: Physical Therapy

## 2014-05-01 ENCOUNTER — Ambulatory Visit: Payer: No Typology Code available for payment source

## 2014-11-27 ENCOUNTER — Emergency Department (HOSPITAL_COMMUNITY)
Admission: EM | Admit: 2014-11-27 | Discharge: 2014-11-27 | Disposition: A | Discharge status: Home / Self Care | Payer: No Typology Code available for payment source | Attending: Emergency Medicine | Admitting: Emergency Medicine

## 2014-11-27 ENCOUNTER — Emergency Department (HOSPITAL_COMMUNITY): Payer: No Typology Code available for payment source

## 2014-11-27 ENCOUNTER — Encounter (HOSPITAL_COMMUNITY): Payer: Self-pay

## 2014-11-27 DIAGNOSIS — Y998 Other external cause status: Secondary | ICD-10-CM | POA: Insufficient documentation

## 2014-11-27 DIAGNOSIS — I10 Essential (primary) hypertension: Secondary | ICD-10-CM | POA: Diagnosis not present

## 2014-11-27 DIAGNOSIS — Z880 Allergy status to penicillin: Secondary | ICD-10-CM | POA: Diagnosis not present

## 2014-11-27 DIAGNOSIS — Y92410 Unspecified street and highway as the place of occurrence of the external cause: Secondary | ICD-10-CM | POA: Diagnosis not present

## 2014-11-27 DIAGNOSIS — Z7982 Long term (current) use of aspirin: Secondary | ICD-10-CM | POA: Insufficient documentation

## 2014-11-27 DIAGNOSIS — Z720 Tobacco use: Secondary | ICD-10-CM | POA: Diagnosis not present

## 2014-11-27 DIAGNOSIS — E119 Type 2 diabetes mellitus without complications: Secondary | ICD-10-CM | POA: Diagnosis not present

## 2014-11-27 DIAGNOSIS — Z859 Personal history of malignant neoplasm, unspecified: Secondary | ICD-10-CM | POA: Diagnosis not present

## 2014-11-27 DIAGNOSIS — Y9389 Activity, other specified: Secondary | ICD-10-CM | POA: Diagnosis not present

## 2014-11-27 DIAGNOSIS — Z79899 Other long term (current) drug therapy: Secondary | ICD-10-CM | POA: Insufficient documentation

## 2014-11-27 DIAGNOSIS — Z791 Long term (current) use of non-steroidal anti-inflammatories (NSAID): Secondary | ICD-10-CM | POA: Insufficient documentation

## 2014-11-27 DIAGNOSIS — Z7952 Long term (current) use of systemic steroids: Secondary | ICD-10-CM | POA: Diagnosis not present

## 2014-11-27 DIAGNOSIS — S5012XA Contusion of left forearm, initial encounter: Secondary | ICD-10-CM

## 2014-11-27 DIAGNOSIS — Z7951 Long term (current) use of inhaled steroids: Secondary | ICD-10-CM | POA: Diagnosis not present

## 2014-11-27 DIAGNOSIS — S59912A Unspecified injury of left forearm, initial encounter: Secondary | ICD-10-CM | POA: Diagnosis present

## 2014-11-27 DIAGNOSIS — S40012A Contusion of left shoulder, initial encounter: Secondary | ICD-10-CM | POA: Diagnosis not present

## 2014-11-27 DIAGNOSIS — S7002XA Contusion of left hip, initial encounter: Secondary | ICD-10-CM

## 2014-11-27 MED ORDER — ACETAMINOPHEN 500 MG PO TABS
1000.0000 mg | ORAL_TABLET | Freq: Once | ORAL | Status: AC
  Administered 2014-11-27: 1000 mg via ORAL
  Filled 2014-11-27: qty 2

## 2014-11-27 MED ORDER — TRAMADOL HCL 50 MG PO TABS: 50.0000 mg | ORAL_TABLET | Freq: Four times a day (QID) | ORAL | Status: DC | PRN

## 2014-11-27 NOTE — Discharge Instructions (Signed)

## 2014-11-27 NOTE — ED Notes (Signed)
Ice bag to left forearm.

## 2014-11-27 NOTE — ED Notes (Signed)
Patient transported to X-ray 
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
## 2016-07-16 ENCOUNTER — Encounter (HOSPITAL_COMMUNITY): Payer: Self-pay | Admitting: *Deleted

## 2016-07-16 ENCOUNTER — Emergency Department (HOSPITAL_COMMUNITY): Payer: Medicare HMO

## 2016-07-16 ENCOUNTER — Emergency Department (HOSPITAL_COMMUNITY)
Admission: EM | Admit: 2016-07-16 | Discharge: 2016-07-16 | Disposition: A | Discharge status: Home / Self Care | Payer: Medicare HMO | Attending: Emergency Medicine | Admitting: Emergency Medicine

## 2016-07-16 DIAGNOSIS — Z79899 Other long term (current) drug therapy: Secondary | ICD-10-CM | POA: Diagnosis not present

## 2016-07-16 DIAGNOSIS — Z7984 Long term (current) use of oral hypoglycemic drugs: Secondary | ICD-10-CM | POA: Diagnosis not present

## 2016-07-16 DIAGNOSIS — E119 Type 2 diabetes mellitus without complications: Secondary | ICD-10-CM | POA: Insufficient documentation

## 2016-07-16 DIAGNOSIS — R079 Chest pain, unspecified: Secondary | ICD-10-CM

## 2016-07-16 DIAGNOSIS — R0789 Other chest pain: Secondary | ICD-10-CM | POA: Diagnosis not present

## 2016-07-16 DIAGNOSIS — I10 Essential (primary) hypertension: Secondary | ICD-10-CM | POA: Diagnosis not present

## 2016-07-16 DIAGNOSIS — F17210 Nicotine dependence, cigarettes, uncomplicated: Secondary | ICD-10-CM | POA: Insufficient documentation

## 2016-07-16 DIAGNOSIS — Z859 Personal history of malignant neoplasm, unspecified: Secondary | ICD-10-CM | POA: Diagnosis not present

## 2016-07-16 DIAGNOSIS — Z7982 Long term (current) use of aspirin: Secondary | ICD-10-CM | POA: Diagnosis not present

## 2016-07-16 LAB — BASIC METABOLIC PANEL
ANION GAP: 6 (ref 5–15)
BUN: 8 mg/dL (ref 6–20)
CALCIUM: 9.6 mg/dL (ref 8.9–10.3)
CO2: 28 mmol/L (ref 22–32)
Chloride: 104 mmol/L (ref 101–111)
Creatinine, Ser: 1.03 mg/dL (ref 0.61–1.24)
Glucose, Bld: 128 mg/dL — ABNORMAL HIGH (ref 65–99)
Potassium: 4.8 mmol/L (ref 3.5–5.1)
Sodium: 138 mmol/L (ref 135–145)

## 2016-07-16 LAB — CBC
HEMATOCRIT: 37 % — AB (ref 39.0–52.0)
HEMOGLOBIN: 12.2 g/dL — AB (ref 13.0–17.0)
MCH: 28.2 pg (ref 26.0–34.0)
MCHC: 33 g/dL (ref 30.0–36.0)
MCV: 85.6 fL (ref 78.0–100.0)
Platelets: 327 10*3/uL (ref 150–400)
RBC: 4.32 MIL/uL (ref 4.22–5.81)
RDW: 13.6 % (ref 11.5–15.5)
WBC: 6.9 10*3/uL (ref 4.0–10.5)

## 2016-07-16 LAB — I-STAT TROPONIN, ED
TROPONIN I, POC: 0.01 ng/mL (ref 0.00–0.08)
TROPONIN I, POC: 0 ng/mL (ref 0.00–0.08)

## 2016-07-16 LAB — D-DIMER, QUANTITATIVE (NOT AT ARMC): D DIMER QUANT: 0.38 ug{FEU}/mL (ref 0.00–0.50)

## 2016-07-16 MED ORDER — ASPIRIN 81 MG PO CHEW
324.0000 mg | CHEWABLE_TABLET | Freq: Once | ORAL | Status: AC
  Administered 2016-07-16: 243 mg via ORAL
  Filled 2016-07-16: qty 4

## 2016-07-16 NOTE — ED Provider Notes (Signed)
[facility] DEPT Provider Note   CSN: [id number] Arrival date & time: 07/16/16  1203     History   Chief Complaint Chief Complaint  Patient presents with  . Chest Pain    HPI Danny Mccall is a 70 y.o. male.  The history is provided by the patient.  Chest Pain   This is a new problem. The current episode started yesterday. The problem has been gradually worsening. The pain is present in the lateral region (left). The quality of the pain is described as pressure-like. Duration of episode(s) is 2 minutes. Associated symptoms include numbness (in feet) and shortness of breath. Pertinent negatives include no fever and no nausea. He has tried nothing for the symptoms.  His past medical history is significant for diabetes and hypertension.  Pertinent negatives for past medical history include no PE.  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 He is treated at another medical facility for his prostate cancer.Marland Kitchen He denies any chest pain currently in the emergency room.  Past Medical History:  Diagnosis Date  . Cancer ([place])   . Diabetes mellitus without complication ([place])   . Hypertension     Patient Active Problem List   Diagnosis Date Noted  . Chest pain 07/30/2013  . HTN (hypertension) 07/30/2013  . Diabetes ([place]) 07/30/2013    Past Surgical History:  Procedure Laterality Date  . ABDOMINAL SURGERY    . CHOLECYSTECTOMY         Home Medications    Prior to Admission medications   Medication Sig Start Date End Date Taking? Authorizing Provider  aspirin EC 81 MG tablet Take 81 mg by mouth daily.    Historical Provider, MD  atorvastatin (LIPITOR) 10 MG tablet Take 10 mg by mouth at bedtime.    Historical Provider, MD  Calcium Carbonate-Vitamin D (CALCIUM-VITAMIN D) 500-200 MG-UNIT per tablet Take 1 tablet by mouth daily.    Historical Provider, MD  cetirizine (ZYRTEC) 10 MG tablet Take 10 mg by mouth daily. 08/18/14 08/18/15  Historical  Provider, MD  fluticasone (FLONASE) 50 MCG/ACT nasal spray Place 1 spray into the nose every 6 (six) hours as needed. 08/18/14 08/18/15  Historical Provider, MD  HYDROcodone-acetaminophen (NORCO/VICODIN) 5-325 MG per tablet Take 1-2 tablets by mouth every 6 (six) hours as needed for moderate pain or severe pain. 03/23/14   Montine Circle, PA-C  ketoconazole (NIZORAL) 2 % shampoo Apply 1 application topically once a week. 08/18/14   Historical Provider, MD  L-Methylfolate 7.5 MG TABS Take 7.5 mg by mouth daily. 08/15/13   Historical Provider, MD  latanoprost (XALATAN) 0.005 % ophthalmic solution Place 1 drop into both eyes at bedtime.    Historical Provider, MD  lisinopril (PRINIVIL,ZESTRIL) 10 MG tablet Take 10 mg by mouth daily.    Historical Provider, MD  meloxicam (MOBIC) 15 MG tablet Take 15 mg by mouth daily as needed for pain.  10/14/14   Historical Provider, MD  metFORMIN (GLUCOPHAGE) 1000 MG tablet Take 1,000 mg by mouth 2 (two) times daily with a meal. 07/31/13   Modena Jansky, MD  montelukast (SINGULAIR) 10 MG tablet Take 10 mg by mouth daily. 11/06/14 11/06/15  Historical Provider, MD  sitaGLIPtin (JANUVIA) 50 MG tablet Take 50 mg by mouth daily.    Historical Provider, MD  traMADol (ULTRAM) 50 MG tablet Take 50 mg by mouth daily as needed. 08/18/14   Historical Provider, MD  traMADol (ULTRAM) 50 MG tablet Take 1 tablet (50  mg total) by mouth every 6 (six) hours as needed. 11/27/14   Charlesetta Shanks, MD  triamcinolone cream (KENALOG) 0.1 % Apply 1 application topically daily as needed (for itching). Apply to legs    Historical Provider, MD    Family History History reviewed. No pertinent family history.  Social History Social History  Substance Use Topics  . Smoking status: Current Every Day Smoker    Packs/day: 0.50    Types: Cigarettes  . Smokeless tobacco: Not on file  . Alcohol use Yes     Allergies   Penicillins   Review of Systems Review of Systems  Constitutional:  Negative for fever.  Respiratory: Positive for shortness of breath.   Cardiovascular: Positive for chest pain.  Gastrointestinal: Negative for nausea.  Neurological: Positive for numbness (in feet).  All other systems reviewed and are negative.    Physical Exam Updated Vital Signs BP 121/90   Pulse 81   Temp 98.6 F (37 C) (Oral)   Resp 15   Ht 5\' 11"  (1.803 m)   Wt 100.7 kg   SpO2 99%   BMI 30.96 kg/m   Physical Exam  Constitutional: No distress.  HENT:  Head: Normocephalic and atraumatic.  Right Ear: External ear normal.  Left Ear: External ear normal.  Eyes: Conjunctivae are normal. Right eye exhibits no discharge. Left eye exhibits no discharge. No scleral icterus.  Neck: Neck supple. No tracheal deviation present.  Cardiovascular: Normal rate, regular rhythm and intact distal pulses.   Pulmonary/Chest: Effort normal and breath sounds normal. No stridor. No respiratory distress. He has no wheezes. He has no rales.  Abdominal: Soft. Bowel sounds are normal. He exhibits no distension. There is no tenderness. There is no rebound and no guarding.  Musculoskeletal: He exhibits no edema or tenderness.  Neurological: He is alert. He has normal strength. No cranial nerve deficit (no facial droop, extraocular movements intact, no slurred speech) or sensory deficit. He exhibits normal muscle tone. He displays no seizure activity. Coordination normal.  Skin: Skin is warm and dry. No rash noted.  Psychiatric: He has a normal mood and affect.  Nursing note and vitals reviewed.    ED Treatments / Results  Labs (all labs ordered are listed, but only abnormal results are displayed) Labs Reviewed  BASIC METABOLIC PANEL - Abnormal; Notable for the following:       Result Value   Glucose, Bld 128 (*)    All other components within normal limits  CBC - Abnormal; Notable for the following:    Hemoglobin 12.2 (*)    HCT 37.0 (*)    All other components within normal limits    D-DIMER, QUANTITATIVE (NOT AT [facility])  Randolm Idol, ED  I-STAT TROPOININ, ED    EKG  EKG Interpretation  Date/Time:  Sunday July 16 2016 12:12:44 EST Ventricular Rate:  78 PR Interval:  142 QRS Duration: 82 QT Interval:  400 QTC Calculation: 456 R Axis:   79 Text Interpretation:  Normal sinus rhythm Cannot rule out Anterior infarct , age undetermined Abnormal ECG No significant change since last tracing Confirmed by Allaina Brotzman  MD-J, Joesiah Lonon [phone]) on 07/16/2016 12:13:38 PM       Radiology Dg Chest 2 View  Result Date: 07/16/2016 CLINICAL DATA:  Chest pain EXAM: CHEST  2 VIEW COMPARISON:  07/30/2013 chest radiograph. FINDINGS: Stable cardiomediastinal silhouette with normal heart size. No pneumothorax. No pleural effusion. Lungs appear clear, with no acute consolidative airspace disease and no pulmonary edema. IMPRESSION: No  active cardiopulmonary disease. Electronically Signed   By: Ilona Sorrel M.D.   On: 07/16/2016 12:51    Procedures Procedures (including critical care time)  Medications Ordered in ED Medications  aspirin chewable tablet 324 mg (243 mg Oral Given 07/16/16 1305)     Initial Impression / Assessment and Plan / ED Course  I have reviewed the triage vital signs and the nursing notes.  Pertinent labs & imaging results that were available during my care of the patient were reviewed by me and considered in my medical decision making (see chart for details).  Clinical Course as of Jul 16 1630  Nancy Fetter Jul 16, 2016  1446 Discussed admission with patient.  He does not want to stay in the hospital.  He is feeling fine now.  THinks maybe gas was cause.  I explained it could have been his heart causing the sx.  He understands if he has a heart attack it would be very dangerous  [JK]    Clinical Course User Index [JK] Dorie Rank, MD    2nd troponin is negative.  I offered hospitalization for serial cardiac testing, possible stress testing, further evaluation.  Pt does  not want to be admitted to the hospital.  He would like to make sure he gets his bone scan tomorrow that he has scheduled.  Pt understands he can return at any time if he changes his mind.  Final Clinical Impressions(s) / ED Diagnoses   Final diagnoses:  Chest pain, unspecified type    New Prescriptions New Prescriptions   No medications on file     Dorie Rank, MD 07/16/16 [phone]

## 2016-07-16 NOTE — ED Notes (Signed)
Patient transported to X-ray 

## 2016-07-16 NOTE — Discharge Instructions (Signed)
Follow up with your doctor this week to discuss further treatment of your chest pain, return to the ED if your pain returns or if you change your mind about hospitalization

## 2016-07-16 NOTE — ED Triage Notes (Signed)
Pt reports left side chest pains since yesterday. Denies sob but reports bilateral foot numbness. No acute distress noted at triage and ekg done.

## 2016-07-16 NOTE — ED Notes (Signed)
Gave pt coffee, per Burna Mortimer - RN.

## 2017-01-03 ENCOUNTER — Ambulatory Visit (HOSPITAL_COMMUNITY)
Admission: EM | Admit: 2017-01-03 | Discharge: 2017-01-03 | Disposition: A | Discharge status: Home / Self Care | Payer: Medicare HMO | Attending: Internal Medicine | Admitting: Internal Medicine

## 2017-01-03 ENCOUNTER — Ambulatory Visit (INDEPENDENT_AMBULATORY_CARE_PROVIDER_SITE_OTHER): Payer: Medicare HMO

## 2017-01-03 ENCOUNTER — Encounter (HOSPITAL_COMMUNITY): Payer: Self-pay | Admitting: Emergency Medicine

## 2017-01-03 DIAGNOSIS — R0789 Other chest pain: Secondary | ICD-10-CM

## 2017-01-03 DIAGNOSIS — M170 Bilateral primary osteoarthritis of knee: Secondary | ICD-10-CM

## 2017-01-03 DIAGNOSIS — R109 Unspecified abdominal pain: Secondary | ICD-10-CM | POA: Diagnosis not present

## 2017-01-03 LAB — POCT URINALYSIS DIP (DEVICE)
Bilirubin Urine: NEGATIVE
Glucose, UA: NEGATIVE mg/dL
Hgb urine dipstick: NEGATIVE
KETONES UR: NEGATIVE mg/dL
Leukocytes, UA: NEGATIVE
Nitrite: NEGATIVE
PROTEIN: NEGATIVE mg/dL
SPECIFIC GRAVITY, URINE: 1.01 (ref 1.005–1.030)
Urobilinogen, UA: 0.2 mg/dL (ref 0.0–1.0)
pH: 7 (ref 5.0–8.0)

## 2017-01-03 NOTE — Discharge Instructions (Signed)
Your chest wall pain is due to inflammation of the ribs, the muscles between the ribs. This is why it hurts when you move a certain way. Apply ice to this area and he may take your pain medicines as needed. For your knee follow-up with your primary care doctor or orthopedist. Take the medications as needed for her chronic knee pain. At this time that is not much more that we can do in the urgent care for your chronic osteoarthritis of the knee.

## 2017-01-03 NOTE — ED Provider Notes (Signed)
CSN: [id number]     Arrival date & time 01/03/17  1700 History   First MD Initiated Contact with Patient 01/03/17 1734     Chief Complaint  Patient presents with  . Flank Pain   (Consider location/radiation/quality/duration/timing/severity/associated sxs/prior Treatment) 71 year old male with a history of lung cancer, diabetes mellitus and hypertension presents to the urgent care with pain to the left lateral chest wall. It is worse with movement. Started this morning. He does not recall a history of similar pain. He is also complaining of left knee pain and swelling. 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 He states this is similar to those previous occurrences. Pain with weightbearing and to the medial aspect of the knee. He has been seen and treated by his PCP for this type of pain and diagnosed with osteoarthritis. No recent falls, blunt trauma or injury.      Past Medical History:  Diagnosis Date  . Cancer ([place])   . Diabetes mellitus without complication ([place])   . Hypertension    Past Surgical History:  Procedure Laterality Date  . ABDOMINAL SURGERY    . CHOLECYSTECTOMY     History reviewed. No pertinent family history. Social History  Substance Use Topics  . Smoking status: Current Every Day Smoker    Packs/day: 0.50    Types: Cigarettes  . Smokeless tobacco: Not on file  . Alcohol use Yes    Review of Systems  Constitutional: Negative.   HENT: Negative.   Respiratory: Negative for wheezing.        Patient is somewhat equivocal in whether he is actually having shortness of breath or just pain and taking a deep breath. No anterior chest pain, heaviness tightness or falls.  Cardiovascular: Negative for palpitations.  Gastrointestinal: Negative.   Genitourinary: Negative.   Neurological: Negative.     Allergies  Penicillins  Home Medications   Prior to Admission medications   Medication Sig Start Date End Date Taking? Authorizing Provider   aspirin EC 81 MG tablet Take 81 mg by mouth daily.    Historical Provider, MD  atorvastatin (LIPITOR) 10 MG tablet Take 10 mg by mouth at bedtime.    Historical Provider, MD  Calcium Carbonate-Vitamin D (CALCIUM-VITAMIN D) 500-200 MG-UNIT per tablet Take 1 tablet by mouth daily.    Historical Provider, MD  cetirizine (ZYRTEC) 10 MG tablet Take 10 mg by mouth daily. 08/18/14 08/18/15  Historical Provider, MD  fluticasone (FLONASE) 50 MCG/ACT nasal spray Place 1 spray into the nose every 6 (six) hours as needed. 08/18/14 08/18/15  Historical Provider, MD  HYDROcodone-acetaminophen (NORCO/VICODIN) 5-325 MG per tablet Take 1-2 tablets by mouth every 6 (six) hours as needed for moderate pain or severe pain. 03/23/14   Montine Circle, PA-C  ketoconazole (NIZORAL) 2 % shampoo Apply 1 application topically once a week. 08/18/14   Historical Provider, MD  L-Methylfolate 7.5 MG TABS Take 7.5 mg by mouth daily. 08/15/13   Historical Provider, MD  latanoprost (XALATAN) 0.005 % ophthalmic solution Place 1 drop into both eyes at bedtime.    Historical Provider, MD  lisinopril (PRINIVIL,ZESTRIL) 10 MG tablet Take 10 mg by mouth daily.    Historical Provider, MD  meloxicam (MOBIC) 15 MG tablet Take 15 mg by mouth daily as needed for pain.  10/14/14   Historical Provider, MD  metFORMIN (GLUCOPHAGE) 1000 MG tablet Take 1,000 mg by mouth 2 (two) times daily with a meal. 07/31/13   Modena Jansky, MD  montelukast (SINGULAIR)  10 MG tablet Take 10 mg by mouth daily. 11/06/14 11/06/15  Historical Provider, MD  sitaGLIPtin (JANUVIA) 50 MG tablet Take 50 mg by mouth daily.    Historical Provider, MD  traMADol (ULTRAM) 50 MG tablet Take 50 mg by mouth daily as needed. 08/18/14   Historical Provider, MD  traMADol (ULTRAM) 50 MG tablet Take 1 tablet (50 mg total) by mouth every 6 (six) hours as needed. 11/27/14   Charlesetta Shanks, MD  triamcinolone cream (KENALOG) 0.1 % Apply 1 application topically daily as needed (for itching). Apply  to legs    Historical Provider, MD   Meds Ordered and Administered this Visit  Medications - No data to display  BP (!) 159/68 (BP Location: Right Arm)   Pulse 73   Temp 98.1 F (36.7 C) (Oral)   Resp 20   SpO2 100%  No data found.   Physical Exam  Constitutional: He appears well-developed and well-nourished. No distress.  Neck: Neck supple.  Cardiovascular: Normal rate, regular rhythm and normal heart sounds.   Pulmonary/Chest: Effort normal and breath sounds normal. No respiratory distress. He has no wheezes. He has no rales. He exhibits tenderness.  There is tenderness along the left lateral chest wall and ribs with deep palpation. The tenderness is minor however when he attempts to move the torso, stand and lower his pain and said this exacerbates the pain more so then tenderness.  Abdominal: Soft. Bowel sounds are normal. There is no tenderness.  Musculoskeletal: He exhibits no edema.  Left knee with no obvious deformity. No medial or lateral joint effusion palpable along the joint line. Tenderness along the medial aspect of the right knee. Distal neurovascular motor sensory is grossly intact.  Neurological: He is alert.  Skin: Skin is warm and dry.  Nursing note and vitals reviewed.   Urgent Care Course     Procedures (including critical care time)  Labs Review Labs Reviewed  POCT URINALYSIS DIP (DEVICE)    Imaging Review Dg Ribs Unilateral W/chest Left  Result Date: 01/03/2017 CLINICAL DATA:  Left anterior chest pain beginning yesterday. Shortness of breath. Personal history of lung cancer. EXAM: LEFT RIBS AND CHEST - 3+ VIEW COMPARISON:  Two-view chest x-ray 07/16/2016 FINDINGS: The heart size is normal. The lungs are clear. There is no edema or effusion. Dedicated imaging of the left ribs demonstrates no acute or healing fractures. The bowel gas pattern is unremarkable. IMPRESSION: 1. Negative one-view chest x-ray. 2. A imaging of the left ribs without evidence for  acute or healing fractures. Electronically Signed   By: San Morelle M.D.   On: 01/03/2017 18:15     Visual Acuity Review  Right Eye Distance:   Left Eye Distance:   Bilateral Distance:    Right Eye Near:   Left Eye Near:    Bilateral Near:         MDM   1. Right-sided chest wall pain   2. Primary osteoarthritis of both knees    Your chest wall pain is due to inflammation of the ribs, the muscles between the ribs. This is why it hurts when you move a certain way. Apply ice to this area and he may take your pain medicines as needed. For your knee follow-up with your primary care doctor or orthopedist. Take the medications as needed for her chronic knee pain. At this time that is not much more that we can do in the urgent care for your chronic osteoarthritis of the knee.  Janne Napoleon, NP 01/03/17 [phone]

## 2017-01-03 NOTE — ED Triage Notes (Signed)
The patient presented to the [facility] with a complaint of left side pain x 1 day. The patient denied any known injury.

## 2017-05-09 ENCOUNTER — Emergency Department (HOSPITAL_COMMUNITY)
Admission: EM | Admit: 2017-05-09 | Discharge: 2017-05-10 | Disposition: A | Discharge status: Home / Self Care | Payer: Medicare HMO | Attending: Emergency Medicine | Admitting: Emergency Medicine

## 2017-05-09 ENCOUNTER — Encounter (HOSPITAL_COMMUNITY): Payer: Self-pay

## 2017-05-09 ENCOUNTER — Emergency Department (HOSPITAL_COMMUNITY): Payer: Medicare HMO

## 2017-05-09 DIAGNOSIS — Z7984 Long term (current) use of oral hypoglycemic drugs: Secondary | ICD-10-CM | POA: Diagnosis not present

## 2017-05-09 DIAGNOSIS — Z7982 Long term (current) use of aspirin: Secondary | ICD-10-CM | POA: Insufficient documentation

## 2017-05-09 DIAGNOSIS — Z859 Personal history of malignant neoplasm, unspecified: Secondary | ICD-10-CM | POA: Diagnosis not present

## 2017-05-09 DIAGNOSIS — F17210 Nicotine dependence, cigarettes, uncomplicated: Secondary | ICD-10-CM | POA: Insufficient documentation

## 2017-05-09 DIAGNOSIS — I10 Essential (primary) hypertension: Secondary | ICD-10-CM | POA: Insufficient documentation

## 2017-05-09 DIAGNOSIS — R0789 Other chest pain: Secondary | ICD-10-CM | POA: Insufficient documentation

## 2017-05-09 DIAGNOSIS — Z79899 Other long term (current) drug therapy: Secondary | ICD-10-CM | POA: Diagnosis not present

## 2017-05-09 DIAGNOSIS — E119 Type 2 diabetes mellitus without complications: Secondary | ICD-10-CM | POA: Diagnosis not present

## 2017-05-09 LAB — CBC
HCT: 31.9 % — ABNORMAL LOW (ref 39.0–52.0)
Hemoglobin: 10.7 g/dL — ABNORMAL LOW (ref 13.0–17.0)
MCH: 27.7 pg (ref 26.0–34.0)
MCHC: 33.5 g/dL (ref 30.0–36.0)
MCV: 82.6 fL (ref 78.0–100.0)
PLATELETS: 288 10*3/uL (ref 150–400)
RBC: 3.86 MIL/uL — ABNORMAL LOW (ref 4.22–5.81)
RDW: 14.3 % (ref 11.5–15.5)
WBC: 4 10*3/uL (ref 4.0–10.5)

## 2017-05-09 LAB — BASIC METABOLIC PANEL
Anion gap: 6 (ref 5–15)
BUN: 7 mg/dL (ref 6–20)
CALCIUM: 8.6 mg/dL — AB (ref 8.9–10.3)
CO2: 29 mmol/L (ref 22–32)
CREATININE: 0.97 mg/dL (ref 0.61–1.24)
Chloride: 105 mmol/L (ref 101–111)
GFR calc Af Amer: >60 mL/min (ref >60)
GLUCOSE: 120 mg/dL — AB (ref 65–99)
POTASSIUM: 3.6 mmol/L (ref 3.5–5.1)
SODIUM: 140 mmol/L (ref 135–145)

## 2017-05-09 LAB — I-STAT TROPONIN, ED: TROPONIN I, POC: 0 ng/mL (ref 0.00–0.08)

## 2017-05-09 NOTE — ED Triage Notes (Signed)
Chest pain since yesterday at home and states now it comes and goes no radiation of pain no symptoms voiced.  States has hx of GERD.

## 2017-05-09 NOTE — ED Notes (Signed)
Bed: [facility]PT1 Expected date:  Expected time:  Means of arrival:  Comments: 

## 2017-05-10 LAB — I-STAT TROPONIN, ED: TROPONIN I, POC: 0.01 ng/mL (ref 0.00–0.08)

## 2017-05-10 MED ORDER — PANTOPRAZOLE SODIUM 20 MG PO TBEC: 20.0000 mg | DELAYED_RELEASE_TABLET | Freq: Every day | ORAL | 3 refills | Status: DC

## 2017-05-10 NOTE — ED Provider Notes (Signed)
[facility] DEPT Provider Note   CSN: [id number] Arrival date & time: 05/09/17  2305     History   Chief Complaint Chief Complaint  Patient presents with  . Chest Pain    HPI Danny Mccall is a 71 y.o. male.  Patient presents to the emergency department for evaluation of chest pain. He started noticing pains after a breakfast today. He reports a recurrent pain in the left lateral chest area intermittently through the day. He comes on suddenly, lasts for a minute or 2 and goes away. He has not identified any cause of the pain. He has not tried anything to alleviate it. He reports that he thinks it's gas, but he "didn't want to take any chances". He does not have any known cardiac disease.      Past Medical History:  Diagnosis Date  . Cancer ([place])   . Diabetes mellitus without complication ([place])   . Hypertension     Patient Active Problem List   Diagnosis Date Noted  . Chest pain 07/30/2013  . HTN (hypertension) 07/30/2013  . Diabetes ([place]) 07/30/2013    Past Surgical History:  Procedure Laterality Date  . ABDOMINAL SURGERY    . CHOLECYSTECTOMY         Home Medications    Prior to Admission medications   Medication Sig Start Date End Date Taking? Authorizing Provider  aspirin EC 81 MG tablet Take 81 mg by mouth daily.    [provider]  atorvastatin (LIPITOR) 10 MG tablet Take 10 mg by mouth at bedtime.    [provider]  Calcium Carbonate-Vitamin D (CALCIUM-VITAMIN D) 500-200 MG-UNIT per tablet Take 1 tablet by mouth daily.    [provider]  cetirizine (ZYRTEC) 10 MG tablet Take 10 mg by mouth daily. 08/18/14 08/18/15  [provider]  fluticasone (FLONASE) 50 MCG/ACT nasal spray Place 1 spray into the nose every 6 (six) hours as needed. 08/18/14 08/18/15  [provider]  HYDROcodone-acetaminophen (NORCO/VICODIN) 5-325 MG per tablet Take 1-2 tablets by mouth every 6 (six) hours as needed for moderate pain  or severe pain. 03/23/14   Montine Circle, PA-C  ketoconazole (NIZORAL) 2 % shampoo Apply 1 application topically once a week. 08/18/14   [provider]  L-Methylfolate 7.5 MG TABS Take 7.5 mg by mouth daily. 08/15/13   [provider]  latanoprost (XALATAN) 0.005 % ophthalmic solution Place 1 drop into both eyes at bedtime.    [provider]  lisinopril (PRINIVIL,ZESTRIL) 10 MG tablet Take 10 mg by mouth daily.    [provider]  meloxicam (MOBIC) 15 MG tablet Take 15 mg by mouth daily as needed for pain.  10/14/14   [provider]  metFORMIN (GLUCOPHAGE) 1000 MG tablet Take 1,000 mg by mouth 2 (two) times daily with a meal. 07/31/13   Hongalgi, Lenis Dickinson, MD  montelukast (SINGULAIR) 10 MG tablet Take 10 mg by mouth daily. 11/06/14 11/06/15  [provider]  pantoprazole (PROTONIX) 20 MG tablet Take 1 tablet (20 mg total) by mouth daily. 05/10/17   Orpah Greek, MD  sitaGLIPtin (JANUVIA) 50 MG tablet Take 50 mg by mouth daily.    [provider]  traMADol (ULTRAM) 50 MG tablet Take 50 mg by mouth daily as needed. 08/18/14   [provider]  traMADol (ULTRAM) 50 MG tablet Take 1 tablet (50 mg total) by mouth every 6 (six) hours as needed. 11/27/14   Charlesetta Shanks, MD  triamcinolone cream (KENALOG)  0.1 % Apply 1 application topically daily as needed (for itching). Apply to legs    [provider]    Family History History reviewed. No pertinent family history.  Social History Social History  Substance Use Topics  . Smoking status: Current Every Day Smoker    Packs/day: 0.50    Types: Cigarettes  . Smokeless tobacco: Never Used  . Alcohol use Yes     Allergies   Penicillins   Review of Systems Review of Systems  Respiratory: Negative for shortness of breath.   Cardiovascular: Positive for chest pain.  All other systems reviewed and are negative.    Physical Exam Updated Vital Signs BP (!)  193/95 (BP Location: Left Arm)   Pulse 68   Temp 97.9 F (36.6 C) (Oral)   Resp 17   SpO2 100%   Physical Exam  Constitutional: He is oriented to person, place, and time. He appears well-developed and well-nourished. No distress.  HENT:  Head: Normocephalic and atraumatic.  Right Ear: Hearing normal.  Left Ear: Hearing normal.  Nose: Nose normal.  Mouth/Throat: Oropharynx is clear and moist and mucous membranes are normal.  Eyes: Pupils are equal, round, and reactive to light. Conjunctivae and EOM are normal.  Neck: Normal range of motion. Neck supple.  Cardiovascular: Regular rhythm, S1 normal and S2 normal.  Exam reveals no gallop and no friction rub.   No murmur heard. Pulmonary/Chest: Effort normal and breath sounds normal. No respiratory distress. He exhibits no tenderness.  Abdominal: Soft. Normal appearance and bowel sounds are normal. There is no hepatosplenomegaly. There is no tenderness. There is no rebound, no guarding, no tenderness at McBurney's point and negative Murphy's sign. No hernia.  Musculoskeletal: Normal range of motion.  Neurological: He is alert and oriented to person, place, and time. He has normal strength. No cranial nerve deficit or sensory deficit. Coordination normal. GCS eye subscore is 4. GCS verbal subscore is 5. GCS motor subscore is 6.  Skin: Skin is warm, dry and intact. No rash noted. No cyanosis.  Psychiatric: He has a normal mood and affect. His speech is normal and behavior is normal. Thought content normal.  Nursing note and vitals reviewed.    ED Treatments / Results  Labs (all labs ordered are listed, but only abnormal results are displayed) Labs Reviewed  BASIC METABOLIC PANEL - Abnormal; Notable for the following:       Result Value   Glucose, Bld 120 (*)    Calcium 8.6 (*)    All other components within normal limits  CBC - Abnormal; Notable for the following:    RBC 3.86 (*)    Hemoglobin 10.7 (*)    HCT 31.9 (*)    All other  components within normal limits  I-STAT TROPONIN, ED  I-STAT TROPONIN, ED    EKG  EKG Interpretation  Date/Time:  Wednesday May 09 2017 23:13:23 EDT Ventricular Rate:  63 PR Interval:    QRS Duration: 88 QT Interval:  437 QTC Calculation: 448 R Axis:   80 Text Interpretation:  Sinus rhythm Low voltage, precordial leads Probable anteroseptal infarct, old Confirmed by Orpah Greek [phone]) on 05/10/2017 3:53:55 AM       Radiology Dg Chest 2 View  Result Date: 05/10/2017 CLINICAL DATA:  LEFT chest pain.  History of metastatic lung cancer. EXAM: CHEST  2 VIEW COMPARISON:  Chest radiograph January 03, 2017 FINDINGS: The heart size and mediastinal contours are within normal limits. Mildly increased lung volumes. Bibasilar  strandy densities. Both lungs are clear. The visualized skeletal structures are nonacute. IMPRESSION: Mild hyperinflation bibasilar atelectasis/scarring. Electronically Signed   By: Elon Alas M.D.   On: 05/10/2017 00:00    Procedures Procedures (including critical care time)  Medications Ordered in ED Medications - No data to display   Initial Impression / Assessment and Plan / ED Course  I have reviewed the triage vital signs and the nursing notes.  Pertinent labs & imaging results that were available during my care of the patient were reviewed by me and considered in my medical decision making (see chart for details).     Patient presents to the ER with atypical chest pain. He has been experiencing intermittent episodes of stabbing pain on his left chest intermittently through the course of the day. Symptoms only last for a couple of minutes and then resolved. No relation to exertion. No shortness of breath. Patient does not have any known urinary artery disease. EKG nonspecific arrival. Troponin negative. His second troponin ordered after 4 hours which was also negative. Patient felt to be very low risk for cardiac etiology, appropriate for  discharge and outpatient follow-up.  Final Clinical Impressions(s) / ED Diagnoses   Final diagnoses:  Atypical chest pain    New Prescriptions New Prescriptions   PANTOPRAZOLE (PROTONIX) 20 MG TABLET    Take 1 tablet (20 mg total) by mouth daily.     Orpah Greek, MD 05/10/17 [phone]

## 2017-06-21 ENCOUNTER — Encounter (HOSPITAL_COMMUNITY): Payer: Self-pay

## 2017-06-21 ENCOUNTER — Emergency Department (HOSPITAL_COMMUNITY)
Admission: EM | Admit: 2017-06-21 | Discharge: 2017-06-21 | Disposition: A | Discharge status: Home / Self Care | Payer: Medicare HMO | Attending: Emergency Medicine | Admitting: Emergency Medicine

## 2017-06-21 DIAGNOSIS — M542 Cervicalgia: Secondary | ICD-10-CM | POA: Diagnosis not present

## 2017-06-21 DIAGNOSIS — Z79899 Other long term (current) drug therapy: Secondary | ICD-10-CM | POA: Insufficient documentation

## 2017-06-21 DIAGNOSIS — E119 Type 2 diabetes mellitus without complications: Secondary | ICD-10-CM | POA: Insufficient documentation

## 2017-06-21 DIAGNOSIS — I10 Essential (primary) hypertension: Secondary | ICD-10-CM | POA: Diagnosis not present

## 2017-06-21 DIAGNOSIS — Z87891 Personal history of nicotine dependence: Secondary | ICD-10-CM | POA: Diagnosis not present

## 2017-06-21 DIAGNOSIS — Z859 Personal history of malignant neoplasm, unspecified: Secondary | ICD-10-CM | POA: Insufficient documentation

## 2017-06-21 DIAGNOSIS — Z7984 Long term (current) use of oral hypoglycemic drugs: Secondary | ICD-10-CM | POA: Insufficient documentation

## 2017-06-21 DIAGNOSIS — G8929 Other chronic pain: Secondary | ICD-10-CM | POA: Diagnosis not present

## 2017-06-21 DIAGNOSIS — M545 Low back pain: Secondary | ICD-10-CM | POA: Diagnosis not present

## 2017-06-21 DIAGNOSIS — Z7982 Long term (current) use of aspirin: Secondary | ICD-10-CM | POA: Diagnosis not present

## 2017-06-21 MED ORDER — ACETAMINOPHEN 500 MG PO TABS
500.0000 mg | ORAL_TABLET | Freq: Once | ORAL | Status: AC
  Administered 2017-06-21: 500 mg via ORAL
  Filled 2017-06-21: qty 1
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
## 2017-06-21 NOTE — Discharge Instructions (Signed)
Please read instructions below. Apply ice to your shoulders and back for 20 minutes at a time. You can take tylenol every 6 hours as needed for pain. Schedule an appointment with your primary care provider to follow-up on your visit today. Return to the ER for severe headache, vision changes, vomiting, bowel or bladder incontinence, numbness or tingling, or new or concerning symptoms.
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
## 2017-10-12 DIAGNOSIS — K269 Duodenal ulcer, unspecified as acute or chronic, without hemorrhage or perforation: Secondary | ICD-10-CM

## 2017-10-12 HISTORY — DX: Duodenal ulcer, unspecified as acute or chronic, without hemorrhage or perforation: K26.9

## 2017-11-04 ENCOUNTER — Inpatient Hospital Stay (HOSPITAL_COMMUNITY)
Admission: EM | Admit: 2017-11-04 | Discharge: 2017-11-07 | DRG: 384 | Disposition: A | Discharge status: Home / Self Care | Payer: Medicare HMO | Attending: Internal Medicine | Admitting: Internal Medicine

## 2017-11-04 ENCOUNTER — Encounter (HOSPITAL_COMMUNITY): Payer: Self-pay | Admitting: Emergency Medicine

## 2017-11-04 ENCOUNTER — Other Ambulatory Visit: Payer: Self-pay

## 2017-11-04 DIAGNOSIS — R195 Other fecal abnormalities: Secondary | ICD-10-CM

## 2017-11-04 DIAGNOSIS — Z809 Family history of malignant neoplasm, unspecified: Secondary | ICD-10-CM

## 2017-11-04 DIAGNOSIS — E785 Hyperlipidemia, unspecified: Secondary | ICD-10-CM | POA: Diagnosis present

## 2017-11-04 DIAGNOSIS — E119 Type 2 diabetes mellitus without complications: Secondary | ICD-10-CM

## 2017-11-04 DIAGNOSIS — D62 Acute posthemorrhagic anemia: Secondary | ICD-10-CM | POA: Diagnosis present

## 2017-11-04 DIAGNOSIS — Z7952 Long term (current) use of systemic steroids: Secondary | ICD-10-CM | POA: Diagnosis not present

## 2017-11-04 DIAGNOSIS — Z7982 Long term (current) use of aspirin: Secondary | ICD-10-CM | POA: Diagnosis not present

## 2017-11-04 DIAGNOSIS — B9681 Helicobacter pylori [H. pylori] as the cause of diseases classified elsewhere: Secondary | ICD-10-CM | POA: Diagnosis present

## 2017-11-04 DIAGNOSIS — Z7951 Long term (current) use of inhaled steroids: Secondary | ICD-10-CM | POA: Diagnosis not present

## 2017-11-04 DIAGNOSIS — C7951 Secondary malignant neoplasm of bone: Secondary | ICD-10-CM | POA: Diagnosis present

## 2017-11-04 DIAGNOSIS — Z880 Allergy status to penicillin: Secondary | ICD-10-CM

## 2017-11-04 DIAGNOSIS — K921 Melena: Secondary | ICD-10-CM | POA: Diagnosis not present

## 2017-11-04 DIAGNOSIS — E118 Type 2 diabetes mellitus with unspecified complications: Secondary | ICD-10-CM

## 2017-11-04 DIAGNOSIS — Z791 Long term (current) use of non-steroidal anti-inflammatories (NSAID): Secondary | ICD-10-CM

## 2017-11-04 DIAGNOSIS — K269 Duodenal ulcer, unspecified as acute or chronic, without hemorrhage or perforation: Principal | ICD-10-CM | POA: Diagnosis present

## 2017-11-04 DIAGNOSIS — Z9049 Acquired absence of other specified parts of digestive tract: Secondary | ICD-10-CM | POA: Diagnosis not present

## 2017-11-04 DIAGNOSIS — N400 Enlarged prostate without lower urinary tract symptoms: Secondary | ICD-10-CM | POA: Diagnosis present

## 2017-11-04 DIAGNOSIS — Z7984 Long term (current) use of oral hypoglycemic drugs: Secondary | ICD-10-CM | POA: Diagnosis not present

## 2017-11-04 DIAGNOSIS — C61 Malignant neoplasm of prostate: Secondary | ICD-10-CM

## 2017-11-04 DIAGNOSIS — Z87891 Personal history of nicotine dependence: Secondary | ICD-10-CM | POA: Diagnosis not present

## 2017-11-04 DIAGNOSIS — I10 Essential (primary) hypertension: Secondary | ICD-10-CM | POA: Diagnosis present

## 2017-11-04 DIAGNOSIS — D649 Anemia, unspecified: Secondary | ICD-10-CM

## 2017-11-04 DIAGNOSIS — K2980 Duodenitis without bleeding: Secondary | ICD-10-CM | POA: Diagnosis present

## 2017-11-04 DIAGNOSIS — Z6832 Body mass index (BMI) 32.0-32.9, adult: Secondary | ICD-10-CM | POA: Diagnosis not present

## 2017-11-04 DIAGNOSIS — Z79891 Long term (current) use of opiate analgesic: Secondary | ICD-10-CM | POA: Diagnosis not present

## 2017-11-04 DIAGNOSIS — E669 Obesity, unspecified: Secondary | ICD-10-CM | POA: Diagnosis present

## 2017-11-04 DIAGNOSIS — Z833 Family history of diabetes mellitus: Secondary | ICD-10-CM

## 2017-11-04 DIAGNOSIS — K2970 Gastritis, unspecified, without bleeding: Secondary | ICD-10-CM | POA: Diagnosis not present

## 2017-11-04 DIAGNOSIS — Z923 Personal history of irradiation: Secondary | ICD-10-CM

## 2017-11-04 DIAGNOSIS — K5900 Constipation, unspecified: Secondary | ICD-10-CM | POA: Diagnosis present

## 2017-11-04 HISTORY — DX: Malignant neoplasm of prostate: C61

## 2017-11-04 HISTORY — DX: Duodenal ulcer, unspecified as acute or chronic, without hemorrhage or perforation: K26.9

## 2017-11-04 LAB — COMPREHENSIVE METABOLIC PANEL
ALK PHOS: 66 U/L (ref 38–126)
ALT: 10 U/L — ABNORMAL LOW (ref 17–63)
ANION GAP: 10 (ref 5–15)
AST: 21 U/L (ref 15–41)
Albumin: 3.2 g/dL — ABNORMAL LOW (ref 3.5–5.0)
BUN: 7 mg/dL (ref 6–20)
CALCIUM: 8.2 mg/dL — AB (ref 8.9–10.3)
CHLORIDE: 104 mmol/L (ref 101–111)
CO2: 25 mmol/L (ref 22–32)
Creatinine, Ser: 0.88 mg/dL (ref 0.61–1.24)
GFR calc Af Amer: >60 mL/min (ref >60)
GFR calc non Af Amer: >60 mL/min (ref >60)
Glucose, Bld: 158 mg/dL — ABNORMAL HIGH (ref 65–99)
POTASSIUM: 3.9 mmol/L (ref 3.5–5.1)
SODIUM: 139 mmol/L (ref 135–145)
Total Bilirubin: 0.6 mg/dL (ref 0.3–1.2)
Total Protein: 5.9 g/dL — ABNORMAL LOW (ref 6.5–8.1)

## 2017-11-04 LAB — PREPARE RBC (CROSSMATCH)

## 2017-11-04 LAB — CBC
HEMATOCRIT: 20.1 % — AB (ref 39.0–52.0)
HEMOGLOBIN: 6.3 g/dL — AB (ref 13.0–17.0)
MCH: 27.8 pg (ref 26.0–34.0)
MCHC: 31.3 g/dL (ref 30.0–36.0)
MCV: 88.5 fL (ref 78.0–100.0)
Platelets: 321 10*3/uL (ref 150–400)
RBC: 2.27 MIL/uL — AB (ref 4.22–5.81)
RDW: 20.6 % — ABNORMAL HIGH (ref 11.5–15.5)
WBC: 7.1 10*3/uL (ref 4.0–10.5)

## 2017-11-04 LAB — POC OCCULT BLOOD, ED: Fecal Occult Bld: POSITIVE — AB

## 2017-11-04 MED ORDER — PANTOPRAZOLE SODIUM 40 MG IV SOLR
40.0000 mg | Freq: Once | INTRAVENOUS | Status: AC
  Administered 2017-11-04: 40 mg via INTRAVENOUS
  Filled 2017-11-04: qty 40

## 2017-11-04 MED ORDER — SODIUM CHLORIDE 0.9 % IV BOLUS (SEPSIS)
1000.0000 mL | Freq: Once | INTRAVENOUS | Status: AC
  Administered 2017-11-04: 1000 mL via INTRAVENOUS

## 2017-11-04 MED ORDER — INSULIN ASPART 100 UNIT/ML ~~LOC~~ SOLN
0.0000 [IU] | SUBCUTANEOUS | Status: DC
  Administered 2017-11-05: 1 [IU] via SUBCUTANEOUS
  Administered 2017-11-05: 2 [IU] via SUBCUTANEOUS
  Administered 2017-11-05: 1 [IU] via SUBCUTANEOUS
  Administered 2017-11-06: 2 [IU] via SUBCUTANEOUS
  Administered 2017-11-06: 1 [IU] via SUBCUTANEOUS
  Administered 2017-11-06: 3 [IU] via SUBCUTANEOUS
  Administered 2017-11-06 – 2017-11-07 (×4): 1 [IU] via SUBCUTANEOUS
  Filled 2017-11-04 (×5): qty 1

## 2017-11-04 MED ORDER — SODIUM CHLORIDE 0.9 % IV SOLN
Freq: Once | INTRAVENOUS | Status: AC
  Administered 2017-11-04: 22:00:00 via INTRAVENOUS

## 2017-11-04 NOTE — ED Provider Notes (Signed)
[name] Provider Note   CSN: [id number] Arrival date & time: 11/04/17  1948     History   Chief Complaint Chief Complaint  Patient presents with  . GI Bleeding    HPI Danny Mccall is a 72 y.o. male.  72 yo M with a significant past medical history of prostate cancer undergoing chemotherapy with a chief complaint of dark and tarry stools.  This been going on since yesterday.  The patient was recently seen for constipation as well as a URI by his PCP.  At that visit he was noted to have a small drop in his hemoglobin down to 7.1.  He was given a 3-day course of Levaquin and started on cough syrup.  The patient feels that his cough and congestion has gotten mildly better but he is concerned about the dark and tarry stools.  Has only had 2 bowel movements of this consistency.  Has been mildly weak but he attributes this to his URI.  Had some transient bilateral lower quadrant abdominal pain that spontaneously resolved a couple days ago.  Denies fevers or chills.  Has been eating and drinking.  Has some lower extremity edema he feels at his baseline.   The history is provided by the patient.  Rectal Bleeding  Quality:  Black and tarry Amount:  Moderate Duration:  1 day Timing:  Constant Chronicity:  New Context: constipation   Similar prior episodes: no   Relieved by:  Nothing Worsened by:  Nothing Ineffective treatments:  None tried Associated symptoms: abdominal pain (lasted for a couple days then resolved)   Associated symptoms: no fever and no vomiting   Risk factors: no anticoagulant use, no hx of colorectal cancer and no NSAID use     Past Medical History:  Diagnosis Date  . Cancer ([place])   . Diabetes mellitus without complication ([place])   . Hypertension     Patient Active Problem List   Diagnosis Date Noted  . Chest pain 07/30/2013  . HTN (hypertension) 07/30/2013  . Diabetes ([place]) 07/30/2013    Past Surgical  History:  Procedure Laterality Date  . ABDOMINAL SURGERY    . CHOLECYSTECTOMY         Home Medications    Prior to Admission medications   Medication Sig Start Date End Date Taking? Authorizing Provider  aspirin EC 81 MG tablet Take 81 mg by mouth daily.    [provider]  atorvastatin (LIPITOR) 10 MG tablet Take 10 mg by mouth at bedtime.    [provider]  Calcium Carbonate-Vitamin D (CALCIUM-VITAMIN D) 500-200 MG-UNIT per tablet Take 1 tablet by mouth daily.    [provider]  cetirizine (ZYRTEC) 10 MG tablet Take 10 mg by mouth daily. 08/18/14 08/18/15  [provider]  fluticasone (FLONASE) 50 MCG/ACT nasal spray Place 1 spray into the nose every 6 (six) hours as needed. 08/18/14 08/18/15  [provider]  HYDROcodone-acetaminophen (NORCO/VICODIN) 5-325 MG per tablet Take 1-2 tablets by mouth every 6 (six) hours as needed for moderate pain or severe pain. 03/23/14   Montine Circle, PA-C  ketoconazole (NIZORAL) 2 % shampoo Apply 1 application topically once a week. 08/18/14   [provider]  L-Methylfolate 7.5 MG TABS Take 7.5 mg by mouth daily. 08/15/13   [provider]  latanoprost (XALATAN) 0.005 % ophthalmic solution Place 1 drop into both eyes at bedtime.    [provider]  lisinopril (PRINIVIL,ZESTRIL) 10 MG tablet Take  10 mg by mouth daily.    [provider]  meloxicam (MOBIC) 15 MG tablet Take 15 mg by mouth daily as needed for pain.  10/14/14   [provider]  metFORMIN (GLUCOPHAGE) 1000 MG tablet Take 1,000 mg by mouth 2 (two) times daily with a meal. 07/31/13   Hongalgi, Lenis Dickinson, MD  montelukast (SINGULAIR) 10 MG tablet Take 10 mg by mouth daily. 11/06/14 11/06/15  [provider]  pantoprazole (PROTONIX) 20 MG tablet Take 1 tablet (20 mg total) by mouth daily. 05/10/17   Orpah Greek, MD  sitaGLIPtin (JANUVIA) 50 MG tablet Take 50 mg by mouth daily.    [provider]  traMADol (ULTRAM) 50 MG tablet Take 50 mg by mouth daily as needed. 08/18/14   [provider]  traMADol (ULTRAM) 50 MG tablet Take 1 tablet (50 mg total) by mouth every 6 (six) hours as needed. 11/27/14   Charlesetta Shanks, MD  triamcinolone cream (KENALOG) 0.1 % Apply 1 application topically daily as needed (for itching). Apply to legs    [provider]    Family History No family history on file.  Social History Social History   Tobacco Use  . Smoking status: Former Smoker    Packs/day: 0.50    Types: Cigarettes    Last attempt to quit: 11/09/2016    Years since quitting: 0.9  . Smokeless tobacco: Never Used  Substance Use Topics  . Alcohol use: No  . Drug use: No     Allergies   Penicillins   Review of Systems Review of Systems  Constitutional: Positive for fatigue. Negative for chills and fever.  HENT: Positive for congestion. Negative for facial swelling.   Eyes: Negative for discharge and visual disturbance.  Respiratory: Positive for cough. Negative for shortness of breath.   Cardiovascular: Negative for chest pain and palpitations.  Gastrointestinal: Positive for abdominal pain (lasted for a couple days then resolved), constipation and hematochezia. Negative for diarrhea and vomiting.  Musculoskeletal: Negative for arthralgias and myalgias.  Skin: Negative for color change and rash.  Neurological: Positive for weakness (generalized). Negative for tremors, syncope and headaches.  Psychiatric/Behavioral: Negative for confusion and dysphoric mood.     Physical Exam Updated Vital Signs BP 119/62 (BP Location: Right Arm)   Pulse 99   Temp 98.6 F (37 C) (Oral)   Resp 18   Ht 5\' 11"  (1.803 m)   Wt 104.3 kg (230 lb)   SpO2 100%   BMI 32.08 kg/m   Physical Exam  Constitutional: He is oriented to person, place, and time. He appears well-developed and well-nourished.  HENT:  Head: Normocephalic and atraumatic.  Swollen  turbinates, posterior nasal drip, no noted sinus ttp, tm normal bilaterally.    Eyes: EOM are normal. Pupils are equal, round, and reactive to light.  Neck: Normal range of motion. Neck supple. No JVD present.  Cardiovascular: Normal rate and regular rhythm. Exam reveals no gallop and no friction rub.  No murmur heard. Pulmonary/Chest: No stridor. No respiratory distress. He has no wheezes.  Abdominal: He exhibits no distension and no mass. There is no tenderness. There is no rebound and no guarding.  Genitourinary:  Genitourinary Comments: Dark and tarry, no hemorrhoids  Musculoskeletal: Normal range of motion. He exhibits edema (3+ to the knees).  Neurological: He is alert and oriented to person, place, and time.  Skin: No rash noted. No pallor.  Psychiatric: He has a normal mood and affect. His behavior is  normal.  Nursing note and vitals reviewed.    ED Treatments / Results  Labs (all labs ordered are listed, but only abnormal results are displayed) Labs Reviewed  COMPREHENSIVE METABOLIC PANEL - Abnormal; Notable for the following components:      Result Value   Glucose, Bld 158 (*)    Calcium 8.2 (*)    Total Protein 5.9 (*)    Albumin 3.2 (*)    ALT 10 (*)    All other components within normal limits  CBC - Abnormal; Notable for the following components:   RBC 2.27 (*)    Hemoglobin 6.3 (*)    HCT 20.1 (*)    RDW 20.6 (*)    All other components within normal limits  POC OCCULT BLOOD, ED - Abnormal; Notable for the following components:   Fecal Occult Bld POSITIVE (*)    All other components within normal limits  TYPE AND SCREEN  PREPARE RBC (CROSSMATCH)    EKG  EKG Interpretation  Date/Time:  Sunday November 04 2017 20:15:20 EST Ventricular Rate:  96 PR Interval:  136 QRS Duration: 78 QT Interval:  386 QTC Calculation: 487 R Axis:   59 Text Interpretation:  Normal sinus rhythm Low voltage QRS Cannot rule out Anterior infarct , age undetermined Abnormal ECG  No significant change since last tracing Confirmed by Katlyn Muldrew ([id number]) on 11/04/2017 8:36:32 PM       Radiology No results found.  Procedures Procedures (including critical care time)  Medications Ordered in ED Medications  sodium chloride 0.9 % bolus 1,000 mL (not administered)  0.9 %  sodium chloride infusion (not administered)  pantoprazole (PROTONIX) injection 40 mg (not administered)     Initial Impression / Assessment and Plan / ED Course  I have reviewed the triage vital signs and the nursing notes.  Pertinent labs & imaging results that were available during my care of the patient were reviewed by me and considered in my medical decision making (see chart for details).     72  yo M with a chief complaint of dark and tarry stools.  The patient is also undergoing chemotherapy at [facility] for prostate cancer.  The patient had a low hemoglobin in the doctor's office a few days ago but repeated today is 6.3.  I will transfuse a unit.  He does have dark and tarry stools so I discussed the case with Dr. Carlean Purl, gastroenterology.  They will evaluate the patient in the morning.  Will discuss with the hospitalist.  CRITICAL CARE Performed by: Cecilio Asper   Total critical care time: 35 minutes  Critical care time was exclusive of separately billable procedures and treating other patients.  Critical care was necessary to treat or prevent imminent or life-threatening deterioration.  Critical care was time spent personally by me on the following activities: development of treatment plan with patient and/or surrogate as well as nursing, discussions with consultants, evaluation of patient's response to treatment, examination of patient, obtaining history from patient or surrogate, ordering and performing treatments and interventions, ordering and review of laboratory studies, ordering and review of radiographic studies, pulse oximetry and re-evaluation of patient's  condition.  The patients results and plan were reviewed and discussed.   Any x-rays performed were independently reviewed by myself.   Differential diagnosis were considered with the presenting HPI.  Medications  sodium chloride 0.9 % bolus 1,000 mL (not administered)  0.9 %  sodium chloride infusion (not administered)  pantoprazole (PROTONIX) injection 40 mg (not administered)  Vitals:   11/04/17 2013  BP: 119/62  Pulse: 99  Resp: 18  Temp: 98.6 F (37 C)  TempSrc: Oral  SpO2: 100%  Weight: 104.3 kg (230 lb)  Height: 5\' 11"  (1.803 m)    Final diagnoses:  Symptomatic anemia    Admission/ observation were discussed with the admitting physician, patient and/or family and they are comfortable with the plan.   Final Clinical Impressions(s) / ED Diagnoses   Final diagnoses:  Symptomatic anemia    ED Discharge Orders    None       Deno Etienne, DO 11/05/17 1019

## 2017-11-04 NOTE — H&P (Signed)
[facility] H&P   Patient Demographics:    Danny Mccall, is a 72 y.o. male  MRN: [id number]   DOB - 05/03/1946  Admit Date - 11/04/2017  Outpatient Primary MD for the patient is Leamon Arnt, MD  Referring MD/NP/PA:  Deno Etienne  Outpatient Specialists:   Hoyle Barr (urology) Bailey Mech (oncology NP)  Patient coming from: home  Chief Complaint  Patient presents with  . GI Bleeding      HPI:    Danny Mccall  is a 72 y.o. 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  He recently had a cold and was tx with some abx, and since that time his stool has been darker.  The ED, noted black stool per physician but looking at his stool in the comode it appears dark brown.  Pt was seen by PCP Coletta Memos) and given stool kit and Hgb 7.1(11/02/2017),  Pt felt more fatigues and had dyspnea on exertion and therefore presented to ED for evaluation.    In ED,  Na 139, K 3.9, Glucose 158, Bun 7, Creatinine 0.88,  Ast 21, Alt 10, Alk phos 66, T. Bili 0.6 Alb 3.2    Wbc 7.1, Hgb 6.3, Plt 321  FOBT +  ED states that they contacted GI for consult and GI will be by in AM  Pt will be admitted for anemia, r/o GI bleeding    Review of systems:    In addition to the HPI above,  No Fever-chills, No Headache, No changes with Vision or hearing, No problems swallowing food or Liquids, No Chest pain, Cough or Shortness of Breath, No Abdominal pain, No Nausea or Vommitting, Bowel movements are regular, No Blood in  Urine, No dysuria, No new skin rashes or bruises, No new joints pains-aches,  No new weakness, tingling, numbness in any extremity, No recent weight gain or loss, No polyuria, polydypsia or polyphagia, No significant Mental Stressors.  A full 10 point Review of Systems was done, except as stated  above, all other Review of Systems were negative.   With Past History of the following :    Past Medical History:  Diagnosis Date  . Cancer ([place])   . Diabetes mellitus without complication ([place])   . Hypertension   . Prostate cancer ([place])    Gleason 9      Past Surgical History:  Procedure Laterality Date  . ABDOMINAL SURGERY    . CHOLECYSTECTOMY    . EYE SURGERY        Social History:     Social History   Tobacco Use  . Smoking status: Former Smoker    Packs/day: 0.50    Types: Cigarettes    Last attempt to quit: 11/09/2016    Years since quitting: 0.9  . Smokeless tobacco: Never Used  Substance Use Topics  . Alcohol  use: No     Lives -  At home  Mobility - walks by self   Family History :     Family History  Problem Relation Age of Onset  . Diabetes Mother   . Cancer Father       Home Medications:   Prior to Admission medications   Medication Sig Start Date End Date Taking? Authorizing Provider  aspirin EC 81 MG tablet Take 81 mg by mouth daily.    [provider]  atorvastatin (LIPITOR) 10 MG tablet Take 10 mg by mouth at bedtime.    [provider]  Calcium Carbonate-Vitamin D (CALCIUM-VITAMIN D) 500-200 MG-UNIT per tablet Take 1 tablet by mouth daily.    [provider]  cetirizine (ZYRTEC) 10 MG tablet Take 10 mg by mouth daily. 08/18/14 08/18/15  [provider]  fluticasone (FLONASE) 50 MCG/ACT nasal spray Place 1 spray into the nose every 6 (six) hours as needed. 08/18/14 08/18/15  [provider]  HYDROcodone-acetaminophen (NORCO/VICODIN) 5-325 MG per tablet Take 1-2 tablets by mouth every 6 (six) hours as needed for moderate pain or severe pain. 03/23/14   Montine Circle, PA-C  ketoconazole (NIZORAL) 2 % shampoo Apply 1 application topically once a week. 08/18/14   [provider]  L-Methylfolate 7.5 MG TABS Take 7.5 mg by mouth daily. 08/15/13   [provider]  latanoprost (XALATAN)  0.005 % ophthalmic solution Place 1 drop into both eyes at bedtime.    [provider]  lisinopril (PRINIVIL,ZESTRIL) 10 MG tablet Take 10 mg by mouth daily.    [provider]  meloxicam (MOBIC) 15 MG tablet Take 15 mg by mouth daily as needed for pain.  10/14/14   [provider]  metFORMIN (GLUCOPHAGE) 1000 MG tablet Take 1,000 mg by mouth 2 (two) times daily with a meal. 07/31/13   Hongalgi, Lenis Dickinson, MD  montelukast (SINGULAIR) 10 MG tablet Take 10 mg by mouth daily. 11/06/14 11/06/15  [provider]  pantoprazole (PROTONIX) 20 MG tablet Take 1 tablet (20 mg total) by mouth daily. 05/10/17   Orpah Greek, MD  sitaGLIPtin (JANUVIA) 50 MG tablet Take 50 mg by mouth daily.    [provider]  traMADol (ULTRAM) 50 MG tablet Take 50 mg by mouth daily as needed. 08/18/14   [provider]  traMADol (ULTRAM) 50 MG tablet Take 1 tablet (50 mg total) by mouth every 6 (six) hours as needed. 11/27/14   Charlesetta Shanks, MD  triamcinolone cream (KENALOG) 0.1 % Apply 1 application topically daily as needed (for itching). Apply to legs    [provider]     Allergies:     Allergies  Allergen Reactions  . Penicillins Hives     Physical Exam:   Vitals  Blood pressure 120/63, pulse (!) 103, temperature 98.3 F (36.8 C), temperature source Oral, resp. rate (!) 24, height _0  (1.803 m), weight 104.3 kg (230 lb), SpO2 98 %.   1. General lying in bed in NAD,    2. Normal affect and insight, Not Suicidal or Homicidal, Awake Alert, Oriented X 3.  3. No F.N deficits, ALL C.Nerves Intact, Strength 5/5 all 4 extremities, Sensation intact all 4 extremities, Plantars down going.  4. Ears and Eyes appear Normal, Conjunctivae PALE, PERRLA. Moist Oral Mucosa.  5. Supple Neck, No JVD, No cervical lymphadenopathy appriciated, No Carotid Bruits.  6. Symmetrical Chest wall movement, Good air movement bilaterally, CTAB.  7. RRR, No  Gallops,  Rubs or Murmurs, No Parasternal Heave.  8. Positive Bowel Sounds, Abdomen Soft, No tenderness, No organomegaly appriciated,No rebound -guarding or rigidity.  9.  No Cyanosis, Normal Skin Turgor, No Skin Rash or Bruise.  10. Good muscle tone,  joints appear normal , no effusions, Normal ROM.  11. No Palpable Lymph Nodes in Neck or Axillae     Data Review:    CBC Recent Labs  Lab 11/04/17 2018  WBC 7.1  HGB 6.3*  HCT 20.1*  PLT 321  MCV 88.5  MCH 27.8  MCHC 31.3  RDW 20.6*   ------------------------------------------------------------------------------------------------------------------  Chemistries  Recent Labs  Lab 11/04/17 2018  NA 139  K 3.9  CL 104  CO2 25  GLUCOSE 158*  BUN 7  CREATININE 0.88  CALCIUM 8.2*  AST 21  ALT 10*  ALKPHOS 66  BILITOT 0.6   ------------------------------------------------------------------------------------------------------------------ estimated creatinine clearance is 96 mL/min (by C-G formula based on SCr of 0.88 mg/dL). ------------------------------------------------------------------------------------------------------------------ No results for input(s): TSH, T4TOTAL, T3FREE, THYROIDAB in the last 72 hours.  Invalid input(s): FREET3  Coagulation profile No results for input(s): INR, PROTIME in the last 168 hours. ------------------------------------------------------------------------------------------------------------------- No results for input(s): DDIMER in the last 72 hours. -------------------------------------------------------------------------------------------------------------------  Cardiac Enzymes No results for input(s): CKMB, TROPONINI, MYOGLOBIN in the last 168 hours.  Invalid input(s): CK ------------------------------------------------------------------------------------------------------------------ No results found for:  BNP   ---------------------------------------------------------------------------------------------------------------  Urinalysis    Component Value Date/Time   COLORURINE YELLOW 08/01/2009 [place] 08/01/2009 1648   LABSPEC 1.010 01/03/2017 1742   PHURINE 7.0 01/03/2017 1742   GLUCOSEU NEGATIVE 01/03/2017 1742   HGBUR NEGATIVE 01/03/2017 1742   BILIRUBINUR NEGATIVE 01/03/2017 1742   KETONESUR NEGATIVE 01/03/2017 1742   PROTEINUR NEGATIVE 01/03/2017 1742   UROBILINOGEN 0.2 01/03/2017 1742   NITRITE NEGATIVE 01/03/2017 1742   LEUKOCYTESUR NEGATIVE 01/03/2017 1742    ----------------------------------------------------------------------------------------------------------------   Imaging Results:    No results found.     Assessment & Plan:    Principal Problem:   Anemia Active Problems:   Diabetes ([place])   Black stool ? GI blood loss anemia STOP Aspirin, pt denies NSAIDS NPO  Transfuse 1 unit prbc as per ED,  Protonix 87m iv x1 then 81mhr iv Check cbc in am Gi consult appreciated  Prostate cancer (gleason 9, metastatic to bone) Next chemo is for Tuesday  BPH Cont flomax  Dm2 fsbs q4h, ISS HOLD off on Janument  Hypertension Cont lisinopril  Hyperlipidemia Cont Lipitor      DVT Prophylaxis - SCDs  AM Labs Ordered, also please review Full Orders  Family Communication: Admission, patients condition and plan of care including tests being ordered have been discussed with the patient  who indicate understanding and agree with the plan and Code Status.  Code Status FULL CODE  Likely DC to  home  Condition GUARDED    Consults called: GI by ED  Admission status: inpatient  Time spent in minutes : 45   JaJani Gravel.D on 11/04/2017 at 10:48 PM  Between 7am to 7pm - Pager - [phone]. After 7pm go to [email or web address] - password [email or web address] - Office  [phone]

## 2017-11-04 NOTE — ED Triage Notes (Signed)
Pt reports shortness of breath and dark stools x 2 days. Pt seen by PCP this week and told his hemoglobin was low. Hx prostate/bone cancer, currently receiving chemo.

## 2017-11-05 ENCOUNTER — Encounter (HOSPITAL_COMMUNITY): Payer: Self-pay | Admitting: Physician Assistant

## 2017-11-05 ENCOUNTER — Inpatient Hospital Stay (HOSPITAL_COMMUNITY): Payer: Medicare HMO | Admitting: Certified Registered Nurse Anesthetist

## 2017-11-05 ENCOUNTER — Encounter (HOSPITAL_COMMUNITY)
Admission: EM | Disposition: A | Discharge status: Home / Self Care | Payer: Self-pay | Source: Home / Self Care | Attending: Internal Medicine

## 2017-11-05 DIAGNOSIS — K2980 Duodenitis without bleeding: Secondary | ICD-10-CM

## 2017-11-05 DIAGNOSIS — R195 Other fecal abnormalities: Secondary | ICD-10-CM

## 2017-11-05 DIAGNOSIS — D62 Acute posthemorrhagic anemia: Secondary | ICD-10-CM

## 2017-11-05 DIAGNOSIS — B9681 Helicobacter pylori [H. pylori] as the cause of diseases classified elsewhere: Secondary | ICD-10-CM

## 2017-11-05 DIAGNOSIS — K269 Duodenal ulcer, unspecified as acute or chronic, without hemorrhage or perforation: Secondary | ICD-10-CM

## 2017-11-05 DIAGNOSIS — D649 Anemia, unspecified: Secondary | ICD-10-CM

## 2017-11-05 DIAGNOSIS — K2970 Gastritis, unspecified, without bleeding: Secondary | ICD-10-CM

## 2017-11-05 HISTORY — PX: ESOPHAGOGASTRODUODENOSCOPY: SHX5428

## 2017-11-05 LAB — COMPREHENSIVE METABOLIC PANEL
ALK PHOS: 65 U/L (ref 38–126)
ALT: 9 U/L — ABNORMAL LOW (ref 17–63)
ANION GAP: 6 (ref 5–15)
AST: 21 U/L (ref 15–41)
Albumin: 2.8 g/dL — ABNORMAL LOW (ref 3.5–5.0)
BILIRUBIN TOTAL: 0.9 mg/dL (ref 0.3–1.2)
BUN: 6 mg/dL (ref 6–20)
CALCIUM: 7.8 mg/dL — AB (ref 8.9–10.3)
CO2: 25 mmol/L (ref 22–32)
Chloride: 110 mmol/L (ref 101–111)
Creatinine, Ser: 0.79 mg/dL (ref 0.61–1.24)
GLUCOSE: 159 mg/dL — AB (ref 65–99)
Potassium: 3.6 mmol/L (ref 3.5–5.1)
Sodium: 141 mmol/L (ref 135–145)
TOTAL PROTEIN: 5.1 g/dL — AB (ref 6.5–8.1)

## 2017-11-05 LAB — CBG MONITORING, ED
Glucose-Capillary: 148 mg/dL — ABNORMAL HIGH (ref 65–99)
Glucose-Capillary: 126 mg/dL — ABNORMAL HIGH (ref 65–99)
Glucose-Capillary: 105 mg/dL — ABNORMAL HIGH (ref 65–99)
Glucose-Capillary: 96 mg/dL (ref 65–99)
Glucose-Capillary: 164 mg/dL — ABNORMAL HIGH (ref 65–99)

## 2017-11-05 LAB — CBC
HCT: 20 % — ABNORMAL LOW (ref 39.0–52.0)
HEMATOCRIT: 28 % — AB (ref 39.0–52.0)
HEMOGLOBIN: 6.3 g/dL — AB (ref 13.0–17.0)
Hemoglobin: 8.7 g/dL — ABNORMAL LOW (ref 13.0–17.0)
MCH: 27.4 pg (ref 26.0–34.0)
MCH: 27.2 pg (ref 26.0–34.0)
MCHC: 31.5 g/dL (ref 30.0–36.0)
MCHC: 31.1 g/dL (ref 30.0–36.0)
MCV: 87 fL (ref 78.0–100.0)
MCV: 87.5 fL (ref 78.0–100.0)
Platelets: 302 10*3/uL (ref 150–400)
Platelets: 304 10*3/uL (ref 150–400)
RBC: 2.3 MIL/uL — AB (ref 4.22–5.81)
RBC: 3.2 MIL/uL — AB (ref 4.22–5.81)
RDW: 19.8 % — ABNORMAL HIGH (ref 11.5–15.5)
RDW: 18.6 % — ABNORMAL HIGH (ref 11.5–15.5)
WBC: 5.4 10*3/uL (ref 4.0–10.5)
WBC: 6.6 10*3/uL (ref 4.0–10.5)

## 2017-11-05 LAB — IRON AND TIBC
IRON: 67 ug/dL (ref 45–182)
Saturation Ratios: 31 % (ref 17.9–39.5)
TIBC: 216 ug/dL — ABNORMAL LOW (ref 250–450)
UIBC: 149 ug/dL

## 2017-11-05 LAB — APTT: APTT: 35 s (ref 24–36)

## 2017-11-05 LAB — FERRITIN: Ferritin: 79 ng/mL (ref 24–336)

## 2017-11-05 LAB — PREPARE RBC (CROSSMATCH)

## 2017-11-05 LAB — PROTIME-INR
INR: 1.12
PROTHROMBIN TIME: 14.3 s (ref 11.4–15.2)

## 2017-11-05 LAB — VITAMIN B12: VITAMIN B 12: 294 pg/mL (ref 180–914)

## 2017-11-05 SURGERY — EGD (ESOPHAGOGASTRODUODENOSCOPY)
Anesthesia: Monitor Anesthesia Care

## 2017-11-05 MED ORDER — ACETAMINOPHEN 325 MG PO TABS: 650.0000 mg | ORAL_TABLET | Freq: Four times a day (QID) | ORAL | Status: DC | PRN

## 2017-11-05 MED ORDER — LISINOPRIL 10 MG PO TABS
10.0000 mg | ORAL_TABLET | Freq: Every day | ORAL | Status: DC
  Administered 2017-11-06 – 2017-11-07 (×2): 10 mg via ORAL
  Filled 2017-11-05 (×2): qty 1

## 2017-11-05 MED ORDER — PROCHLORPERAZINE MALEATE 5 MG PO TABS
5.0000 mg | ORAL_TABLET | Freq: Four times a day (QID) | ORAL | Status: DC | PRN
  Filled 2017-11-05: qty 1

## 2017-11-05 MED ORDER — PANTOPRAZOLE SODIUM 40 MG PO TBEC
40.0000 mg | DELAYED_RELEASE_TABLET | Freq: Two times a day (BID) | ORAL | Status: DC
  Administered 2017-11-06 – 2017-11-07 (×3): 40 mg via ORAL
  Filled 2017-11-05 (×3): qty 1

## 2017-11-05 MED ORDER — SODIUM CHLORIDE 0.9 % IV SOLN
INTRAVENOUS | Status: AC
  Administered 2017-11-05: 04:00:00 via INTRAVENOUS

## 2017-11-05 MED ORDER — SODIUM CHLORIDE 0.9 % IV SOLN
Freq: Once | INTRAVENOUS | Status: AC
  Administered 2017-11-05: 11:00:00 via INTRAVENOUS

## 2017-11-05 MED ORDER — DEXMEDETOMIDINE HCL 200 MCG/2ML IV SOLN
INTRAVENOUS | Status: DC | PRN
  Administered 2017-11-05: 8 ug via INTRAVENOUS
  Administered 2017-11-05: 12 ug via INTRAVENOUS

## 2017-11-05 MED ORDER — OXYCODONE HCL 5 MG PO TABS: 5.0000 mg | ORAL_TABLET | ORAL | Status: DC | PRN

## 2017-11-05 MED ORDER — ATORVASTATIN CALCIUM 20 MG PO TABS
20.0000 mg | ORAL_TABLET | Freq: Every day | ORAL | Status: DC
Start: 2017-11-05 — End: 2017-11-07
  Administered 2017-11-06 – 2017-11-07 (×2): 20 mg via ORAL
  Filled 2017-11-05 (×3): qty 1

## 2017-11-05 MED ORDER — VALACYCLOVIR HCL 500 MG PO TABS
1000.0000 mg | ORAL_TABLET | Freq: Three times a day (TID) | ORAL | Status: DC
  Administered 2017-11-06 – 2017-11-07 (×5): 1000 mg via ORAL
  Filled 2017-11-05 (×8): qty 2

## 2017-11-05 MED ORDER — ACETAMINOPHEN 650 MG RE SUPP: 650.0000 mg | Freq: Four times a day (QID) | RECTAL | Status: DC | PRN

## 2017-11-05 MED ORDER — GABAPENTIN 100 MG PO CAPS
100.0000 mg | ORAL_CAPSULE | Freq: Three times a day (TID) | ORAL | Status: DC
  Administered 2017-11-06 – 2017-11-07 (×4): 100 mg via ORAL
  Filled 2017-11-05 (×5): qty 1

## 2017-11-05 MED ORDER — SODIUM CHLORIDE 0.9 % IV SOLN
INTRAVENOUS | Status: DC | PRN
  Administered 2017-11-05 (×2): via INTRAVENOUS

## 2017-11-05 MED ORDER — PROPOFOL 10 MG/ML IV BOLUS
INTRAVENOUS | Status: DC | PRN
  Administered 2017-11-05: 20 mg via INTRAVENOUS

## 2017-11-05 MED ORDER — PROPOFOL 500 MG/50ML IV EMUL
INTRAVENOUS | Status: DC | PRN
  Administered 2017-11-05: 100 ug/kg/min via INTRAVENOUS

## 2017-11-05 MED ORDER — TAMSULOSIN HCL 0.4 MG PO CAPS
0.4000 mg | ORAL_CAPSULE | Freq: Every day | ORAL | Status: DC
  Administered 2017-11-06 – 2017-11-07 (×2): 0.4 mg via ORAL
  Filled 2017-11-05 (×2): qty 1

## 2017-11-05 MED ORDER — SODIUM CHLORIDE 0.9 % IV SOLN
8.0000 mg/h | INTRAVENOUS | Status: DC
  Administered 2017-11-05 (×2): 8 mg/h via INTRAVENOUS
  Filled 2017-11-05 (×2): qty 80

## 2017-11-05 NOTE — Anesthesia Postprocedure Evaluation (Signed)
Anesthesia Post Note  Patient: Danny Mccall [facility]  Procedure(s) Performed: ESOPHAGOGASTRODUODENOSCOPY (EGD) (N/A )     Patient location during evaluation: PACU Anesthesia Type: MAC Level of consciousness: awake and alert Pain management: pain level controlled Vital Signs Assessment: post-procedure vital signs reviewed and stable Respiratory status: spontaneous breathing, nonlabored ventilation and respiratory function stable Cardiovascular status: stable and blood pressure returned to baseline Postop Assessment: no apparent nausea or vomiting Anesthetic complications: no    Last Vitals:  Vitals:   11/05/17 1410 11/05/17 1415  BP: 115/62 116/64  Pulse:  81  Resp: 16 17  Temp:    SpO2:  98%    Last Pain:  Vitals:   11/05/17 1355  TempSrc: Oral  PainSc:                  [name],W. EDMOND

## 2017-11-05 NOTE — ED Notes (Signed)
Blood to be picked up

## 2017-11-05 NOTE — Op Note (Signed)
[facility] Patient Name: Danny Mccall Procedure Date : 11/05/2017 MRN: [id number] Attending MD: Jerene Bears , MD Date of Birth: 05/11/1946 CSN: [id number] Age: 72 Admit Type: Inpatient Procedure:                Upper GI endoscopy Indications:              Acute post hemorrhagic anemia, Heme positive stool Providers:                Lajuan Lines. Hilarie Fredrickson, MD, Burtis Junes, RN, Cherylynn Ridges,                            Technician, Edmonia James, CRNA Referring MD:             [facility] Medicines:                Monitored Anesthesia Care Complications:            No immediate complications. Estimated Blood Loss:     Estimated blood loss was minimal. Procedure:                Pre-Anesthesia Assessment:                           - Prior to the procedure, a History and Physical                            was performed, and patient medications and                            allergies were reviewed. The patient's tolerance of                            previous anesthesia was also reviewed. The risks                            and benefits of the procedure and the sedation                            options and risks were discussed with the patient.                            All questions were answered, and informed consent                            was obtained. Prior Anticoagulants: The patient has                            taken no previous anticoagulant or antiplatelet                            agents. ASA Grade Assessment: III - A patient with                            severe systemic disease. After reviewing the risks  and benefits, the patient was deemed in                            satisfactory condition to undergo the procedure.                           After obtaining informed consent, the endoscope was                            passed under direct vision. Throughout the                            procedure, the patient's  blood pressure, pulse, and                            oxygen saturations were monitored continuously. The                            [id number] ([id number]) scope was introduced through the                            mouth, and advanced to the second part of duodenum.                            The upper GI endoscopy was accomplished without                            difficulty. The patient tolerated the procedure                            well. Scope In: Scope Out: Findings:      The examined esophagus was normal.      The cardia and gastric fundus were normal on retroflexion.      The entire examined stomach was normal. Biopsies were taken with a cold       forceps for Helicobacter pylori testing.      Localized moderate inflammation characterized by erosions, erythema and       granularity was found in the duodenal bulb.      One non-bleeding cratered duodenal ulcer with no stigmata of bleeding       was found in the duodenal bulb. The lesion was 10 mm in largest       dimension.      The second portion of the duodenum was normal. Impression:               - Normal esophagus.                           - Normal stomach. Biopsied to rule out H. Pylori                            given duodenal ulcer disease.                           - Duodenitis with 1 cratered duodenal ulcer with no  stigmata of bleeding.                           - Normal second portion of the duodenum. Moderate Sedation:      N/A Recommendation:           - Return patient to hospital ward for ongoing care.                           - Advance diet as tolerated.                           - Continue present medications.                           - BID PPI for at least 8 weeks.                           - Avoid aspirin, ibuprofen, naproxen, or other                            non-steroidal anti-inflammatory drugs.                           - Await pathology results to exclude H. Pylori                             infection (treat if positive). Procedure Code(s):        --- Professional ---                           [phone], Esophagogastroduodenoscopy, flexible,                            transoral; with biopsy, single or multiple Diagnosis Code(s):        --- Professional ---                           K29.80, Duodenitis without bleeding                           K26.9, Duodenal ulcer, unspecified as acute or                            chronic, without hemorrhage or perforation                           D62, Acute posthemorrhagic anemia                           R19.5, Other fecal abnormalities CPT copyright 2016 American Medical Association. All rights reserved. The codes documented in this report are preliminary and upon coder review may  be revised to meet current compliance requirements. Jerene Bears, MD 11/05/2017 2:04:14 PM This report has been signed electronically. Number of Addenda: 0

## 2017-11-05 NOTE — ED Notes (Signed)
Checked patient cbg it was 85 notified RN of blood sugar patient is resting with call bell in reach

## 2017-11-05 NOTE — Progress Notes (Addendum)
PROGRESS NOTE  Danny Mccall [place] NZV:[id number] DOB: 05-02-1946 DOA: 11/04/2017 PCP: Leamon Arnt, MD  HPI/Recap of past 24 hours: Danny Mccall  is a 72 y.o. 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  He recently had a cold and was tx with some abx, and since that time his stool has been darker.  The ED, noted black stool per physician but looking at his stool in the comode it appears dark brown.  Pt was seen by PCP Coletta Memos) and given stool kit and Hgb 7.1(11/02/2017),  Pt felt more fatigues and had dyspnea on exertion and therefore presented to ED for evaluation.  Admitted for suspected acute GI bleed.  11/05/17: GI consulted with plans for endoscopy today. Patient seen and examined in the ED. Denies abd pain. Last colonoscopy was about 5 years ago.     Assessment/Plan: Principal Problem:   Anemia Active Problems:   Diabetes ([place])  Melena, acute blood loss anemia with suspected acute GI bleed/Duodenitis/duodenal ulcer -GI consulted -plan for endoscopy today -IV protonix -Hg 6.3; 1 u prbc transfused -transfuse hg as indicated when below 7.0 -last colonoscopy 5 years ago -repeat Cbc am -*EGD (11/05/17) revealed duodenitis duodenal ulcer without bleeding  Duodenitis/duodenal ulcer without bleeding  -EGD done today 11/05/17 -biopsy done to r/o H pylori -avoid NSAIDS -continue PPI BID x 8 weeks  Prostate cancer with mets to bone -next chemotherapy on Tuesday 11/06/17  BPH -on flomax -monitor urine output  Type 2 diabetes -A1c 6.7 (2014) -ISS -avoid hypoglycemia  HTN -stable -continue home meds  HLD -lipitor    Code Status: full  Family Communication: none at bedside   Disposition Plan: to be determined   Consultants:  GI  Procedures:  EGD  Antimicrobials:  none  DVT prophylaxis:  SCDs   Objective: Vitals:   11/05/17 0530 11/05/17 0600 11/05/17 0630 11/05/17  0700  BP: 104/64 136/60 112/69 127/71  Pulse:      Resp: _0 (!) 9  Temp:      TempSrc:      SpO2:      Weight:      Height:        Intake/Output Summary (Last 24 hours) at 11/05/2017 0740 Last data filed at 11/05/2017 0109 Gross per 24 hour  Intake 1315 ml  Output 800 ml  Net 515 ml   Filed Weights   11/04/17 2013  Weight: 104.3 kg (230 lb)    Exam:   General:  72 yo AAM WD wN NAD A&O x 3  Cardiovascular: RRR no rubs or gallops   Respiratory: CTA no wheezes or rales   Abdomen: obese NT ND NBS x4  Musculoskeletal: no focal abnormalities   Skin: no rash  Psychiatry: mood is appropriate for condition and setting   Data Reviewed: CBC: Recent Labs  Lab 11/04/17 2018 11/05/17 0338  WBC 7.1 5.4  HGB 6.3* 6.3*  HCT 20.1* 20.0*  MCV 88.5 87.0  PLT 321 615   Basic Metabolic Panel: Recent Labs  Lab 11/04/17 2018 11/05/17 0338  NA 139 141  K 3.9 3.6  CL 104 110  CO2 25 25  GLUCOSE 158* 159*  BUN 7 6  CREATININE 0.88 0.79  CALCIUM 8.2* 7.8*   GFR: Estimated Creatinine Clearance: 105.6 mL/min (by C-G formula based on SCr of 0.79 mg/dL). Liver Function Tests: Recent Labs  Lab 11/04/17 2018 11/05/17 0338  AST 21 21  ALT 10* 9*  ALKPHOS 66 65  BILITOT 0.6 0.9  PROT 5.9* 5.1*  ALBUMIN 3.2* 2.8*   No results for input(s): LIPASE, AMYLASE in the last 168 hours. No results for input(s): AMMONIA in the last 168 hours. Coagulation Profile: Recent Labs  Lab 11/05/17 0338  INR 1.12   Cardiac Enzymes: No results for input(s): CKTOTAL, CKMB, CKMBINDEX, TROPONINI in the last 168 hours. BNP (last 3 results) No results for input(s): PROBNP in the last 8760 hours. HbA1C: No results for input(s): HGBA1C in the last 72 hours. CBG: Recent Labs  Lab 11/05/17 0346  GLUCAP 148*   Lipid Profile: No results for input(s): CHOL, HDL, LDLCALC, TRIG, CHOLHDL, LDLDIRECT in the last 72 hours. Thyroid Function Tests: No results for input(s): TSH, T4TOTAL,  FREET4, T3FREE, THYROIDAB in the last 72 hours. Anemia Panel: Recent Labs    11/05/17 0338  VITAMINB12 294  FERRITIN 79  TIBC 216*  IRON 67   Urine analysis:    Component Value Date/Time   COLORURINE YELLOW 08/01/2009 1648   APPEARANCEUR CLEAR 08/01/2009 1648   LABSPEC 1.010 01/03/2017 1742   PHURINE 7.0 01/03/2017 1742   GLUCOSEU NEGATIVE 01/03/2017 1742   HGBUR NEGATIVE 01/03/2017 1742   BILIRUBINUR NEGATIVE 01/03/2017 1742   KETONESUR NEGATIVE 01/03/2017 1742   PROTEINUR NEGATIVE 01/03/2017 1742   UROBILINOGEN 0.2 01/03/2017 1742   NITRITE NEGATIVE 01/03/2017 1742   LEUKOCYTESUR NEGATIVE 01/03/2017 1742   Sepsis Labs: _0 (procalcitonin:4,lacticidven:4)  )No results found for this or any previous visit (from the past 240 hour(s)).    Studies: No results found.  Scheduled Meds: . atorvastatin  20 mg Oral Daily  . gabapentin  100 mg Oral TID  . insulin aspart  0-9 Units Subcutaneous Q4H  . lisinopril  10 mg Oral Daily  . tamsulosin  0.4 mg Oral Daily  . valACYclovir  1,000 mg Oral TID    Continuous Infusions: . sodium chloride 75 mL/hr at 11/05/17 0337  . pantoprozole (PROTONIX) infusion 8 mg/hr (11/05/17 0402)     LOS: 1 day     Kayleen Memos, MD [facility] Pager [phone]  If 7PM-7AM, please contact night-coverage [email or web address] Password [email or web address] 11/05/2017, 7:40 AM

## 2017-11-05 NOTE — ED Notes (Signed)
Consent for EGD at bedside.

## 2017-11-05 NOTE — Consult Note (Signed)
[facility] Consult: 8:31 AM 11/05/2017  LOS: 1 day    Referring Provider: Dr Nevada Crane  Primary Care Physician:  Billey Chang.  Dr Coletta Memos.   Primary Gastroenterologist:  unassigned     Reason for Consultation:  Melena, anemia   HPI: Danny Mccall is a 72 y.o. male.  Hx Prostate cancer with bone mets/gleason 9, treated with radiation seeds and more recently Chemo q 3 weeks.  Tomorrow will be chemo session # 6.  Type 2 DM.   HTN.  Colonoscopy in [place] ~ 4 to 5 yrs ago at time of prostate CA Dx.  Gastritis on what sounds like rigid endoscopy 40 yrs ago.     Nearly 2 weeks ago given 3 d of abx for cough.  Cough did not improve.  Last Tuesday stools turned dark but formed.  Later in week conatipated.  PMD added Miralax on Friday.  Pt had dark, looser stools on Sat and Sunday.  Never had n/v.  No abd pain.  Some occasional heartburn.  Daily 81 ASA.  No BC's, NSAIDs etc.  No PPI etc. takes dexamethasone on the day before and day after chemo Hgb of 2/22 at PMD office 7.1.  Was 10.7 in 04/2017 Came to ED with increased fatigue and DOE.      Hgb 6.3 >> 1 U PRBC>> 6.3.  MCV 88.  Platelets, coags ok.  BUN not elevated.  Not iron deficient.   Past Medical History:  Diagnosis Date  . Cancer ([place])   . Diabetes mellitus without complication ([place])   . Hypertension   . Prostate cancer ([place])    Gleason 9    Past Surgical History:  Procedure Laterality Date  . ABDOMINAL SURGERY    . CHOLECYSTECTOMY    . EYE SURGERY      Prior to Admission medications   Medication Sig Start Date End Date Taking? Authorizing Provider  aspirin EC 81 MG tablet Take 81 mg by mouth daily.   Yes [provider]  atorvastatin (LIPITOR) 20 MG tablet Take 20 mg by mouth daily.   Yes [provider]  dexamethasone  (DECADRON) 4 MG tablet Take 4 mg by mouth See admin instructions. Take 1 tablet by mouth twice daily on the day before and the day after chemo for prevention of fluid retention   Yes [provider]  gabapentin (NEURONTIN) 100 MG capsule Take 100 mg by mouth 3 (three) times daily.   Yes [provider]  latanoprost (XALATAN) 0.005 % ophthalmic solution Place 1 drop into the left eye at bedtime.   Yes [provider]  lisinopril (PRINIVIL,ZESTRIL) 10 MG tablet Take 10 mg by mouth daily.   Yes [provider]  oxyCODONE (OXY IR/ROXICODONE) 5 MG immediate release tablet Take 5 mg by mouth every 4 (four) hours as needed for severe pain.   Yes [provider]  prednisoLONE acetate (PRED FORTE) 1 % ophthalmic suspension Place 1 drop into the right eye 2 (two) times daily. 10/25/17  Yes [provider]  prochlorperazine (COMPAZINE) 5 MG  tablet Take 5 mg by mouth every 6 (six) hours as needed for nausea or vomiting.   Yes [provider]  SIMBRINZA 1-0.2 % SUSP Place 1 drop into the right eye 2 (two) times daily. 08/24/17  Yes [provider]  sitaGLIPtin-metformin (JANUMET) 50-1000 MG tablet Take 1 tablet by mouth daily.   Yes [provider]  tamsulosin (FLOMAX) 0.4 MG CAPS capsule Take 0.4 mg by mouth daily.   Yes [provider]  timolol (BETIMOL) 0.5 % ophthalmic solution Place 1 drop into the right eye 2 (two) times daily.   Yes [provider]  valACYclovir (VALTREX) 1000 MG tablet Take 1,000 mg by mouth 3 (three) times daily.   Yes [provider]  HYDROcodone-acetaminophen (NORCO/VICODIN) 5-325 MG per tablet Take 1-2 tablets by mouth every 6 (six) hours as needed for moderate pain or severe pain. Patient not taking: Reported on 11/04/2017 03/23/14   Montine Circle, PA-C  pantoprazole (PROTONIX) 20 MG tablet Take 1 tablet (20 mg total) by mouth daily. Patient not taking: Reported on 11/04/2017  05/10/17   Orpah Greek, MD  traMADol (ULTRAM) 50 MG tablet Take 1 tablet (50 mg total) by mouth every 6 (six) hours as needed. Patient not taking: Reported on 11/04/2017 11/27/14   Charlesetta Shanks, MD    Scheduled Meds: . atorvastatin  20 mg Oral Daily  . gabapentin  100 mg Oral TID  . insulin aspart  0-9 Units Subcutaneous Q4H  . lisinopril  10 mg Oral Daily  . tamsulosin  0.4 mg Oral Daily  . valACYclovir  1,000 mg Oral TID   Infusions: . sodium chloride 75 mL/hr at 11/05/17 0337  . pantoprozole (PROTONIX) infusion 8 mg/hr (11/05/17 0402)   PRN Meds: acetaminophen **OR** acetaminophen, oxyCODONE, prochlorperazine   Allergies as of 11/04/2017 - Review Complete 11/04/2017  Allergen Reaction Noted  . Penicillins Hives 12/05/2011    Family History  Problem Relation Age of Onset  . Diabetes Mother   . Cancer Father     Social History   Socioeconomic History  . Marital status: Married    Spouse name: Not on file  . Number of children: Not on file  . Years of education: Not on file  . Highest education level: Not on file  Social Needs  . Financial resource strain: Not on file  . Food insecurity - worry: Not on file  . Food insecurity - inability: Not on file  . Transportation needs - medical: Not on file  . Transportation needs - non-medical: Not on file  Occupational History  . Not on file  Tobacco Use  . Smoking status: Former Smoker    Packs/day: 0.50    Types: Cigarettes    Last attempt to quit: 11/09/2016    Years since quitting: 0.9  . Smokeless tobacco: Never Used  Substance and Sexual Activity  . Alcohol use: No  . Drug use: No  . Sexual activity: Not on file  Other Topics Concern  . Not on file  Social History Narrative  . Not on file    REVIEW OF SYSTEMS: Constitutional: Weakness and fatigue in recent days. ENT:  No nose bleeds Pulm: Productive cough.  No chest pain CV:  No palpitations, no chest pain.  Chronic, stable lower extremity  edema. GU:  No hematuria, no frequency GI: No dysphagia.  See HPI Heme: Other than the GI symptoms per HPI, no unusual bleeding or bruising.  No prior issues with anemia. Transfusions: None Neuro: Numbness  in his feet. Derm:  No itching, no rash or sores.  Endocrine:  No sweats or chills.  No polyuria or dysuria.   Does not check his blood sugars at home. Immunization: Did not inquire as to recent vaccinations. Travel:  None beyond local counties in last few months.    PHYSICAL EXAM: Vital signs in last 24 hours: Vitals:   11/05/17 0630 11/05/17 0700  BP: 112/69 127/71  Pulse:    Resp: 11 (!) 9  Temp:    SpO2:     Wt Readings from Last 3 Encounters:  11/04/17 104.3 kg (230 lb)  07/16/16 100.7 kg (222 lb)  07/30/13 99.8 kg (220 lb)    General: Obese, pleasant, alert, comfortable.  Looks slightly chronically ill. Head: No asymmetry or facial edema.  No signs of head trauma. Eyes: No scleral icterus, no conjunctival pallor. Ears: Not hard of hearing. Nose: Sounds congested.  No nasal discharge. Mouth: Lots of missing teeth.  Tongue midline.  Oral mucosa pink, moist, clear. Neck: No JVD, no masses, no thyromegaly. Lungs: Productive cough.  No dyspnea.  Lungs clear but breath sounds globally diminished. Heart: RRR.  No MRG.  S1, S2 present. Abdomen: Soft.  Obese.  No masses, HSM, bruits.  Not tender.  Bowel sounds active..   Rectal: Deferred rectal exam.  FOBT positive and dark stool per ED staff. Musc/Skeltl: No joint swelling, redness or significant deformity. Extremities: 1+ pedal/ankle edema. Neurologic: Alert.  Oriented x3.  Good historian.  Moves all 4 limbs.  No tremors.  No gross neurologic deficits. Skin: No rashes or sores. Tattoos: None observed. Nodes: No cervical adenopathy. Psych: Cooperative, calm, pleasant.  Intake/Output from previous day: 02/24 0701 - 02/25 0700 In: 1315 [Blood:315; IV Piggyback:1000] Out: 800 [Urine:800] Intake/Output this shift: No  intake/output data recorded.  LAB RESULTS: Recent Labs    11/04/17 2018 11/05/17 0338  WBC 7.1 5.4  HGB 6.3* 6.3*  HCT 20.1* 20.0*  PLT 321 302   BMET Lab Results  Component Value Date   NA 141 11/05/2017   NA 139 11/04/2017   NA 140 05/09/2017   K 3.6 11/05/2017   K 3.9 11/04/2017   K 3.6 05/09/2017   CL 110 11/05/2017   CL 104 11/04/2017   CL 105 05/09/2017   CO2 25 11/05/2017   CO2 25 11/04/2017   CO2 29 05/09/2017   GLUCOSE 159 (H) 11/05/2017   GLUCOSE 158 (H) 11/04/2017   GLUCOSE 120 (H) 05/09/2017   BUN 6 11/05/2017   BUN 7 11/04/2017   BUN 7 05/09/2017   CREATININE 0.79 11/05/2017   CREATININE 0.88 11/04/2017   CREATININE 0.97 05/09/2017   CALCIUM 7.8 (L) 11/05/2017   CALCIUM 8.2 (L) 11/04/2017   CALCIUM 8.6 (L) 05/09/2017   LFT Recent Labs    11/04/17 2018 11/05/17 0338  PROT 5.9* 5.1*  ALBUMIN 3.2* 2.8*  AST 21 21  ALT 10* 9*  ALKPHOS 66 65  BILITOT 0.6 0.9   PT/INR Lab Results  Component Value Date   INR 1.12 11/05/2017   INR 0.96 06/21/2010   Hepatitis Panel No results for input(s): HEPBSAG, HCVAB, HEPAIGM, HEPBIGM in the last 72 hours. C-Diff No components found for: CDIFF Lipase     Component Value Date/Time   LIPASE 19 06/21/2010 1027    Drugs of Abuse  No results found for: LABOPIA, COCAINSCRNUR, LABBENZ, AMPHETMU, THCU, LABBARB   RADIOLOGY STUDIES: No results found.    IMPRESSION:    *   Dark,  FOBT positive stool normocytic anemia.  No particular risk factors other than daily 81 mg aspirin.  Remote history gastritis on rigid endoscopy 40 years ago.  Not on PPI or H2 blocker.  *    Prostate cancer with bone metastases.  Initially treated with radiation seed implantation.  Tomorrow, if it occurs, will be his sixth cycle of chemo which is given every 3 weeks.   PLAN:     *     Upper endoscopy this afternoon.   Continue the Protonix drip for now.  Needs additional blood as he did not have a bump in Hgb after 1 unit.   Ordered transfusion with 2 more PRBCs.   Azucena Freed  11/05/2017, 8:31 AM Pager: [phone]

## 2017-11-05 NOTE — Anesthesia Preprocedure Evaluation (Addendum)
Anesthesia Evaluation  Patient identified by MRN, date of birth, ID band Patient awake    Reviewed: Allergy & Precautions, H&P , NPO status , Patient's Chart, lab work & pertinent test results  Airway Mallampati: II  TM Distance: >3 FB Neck ROM: Full    Dental no notable dental hx. (+) Edentulous Upper, Partial Lower, Dental Advisory Given   Pulmonary neg pulmonary ROS, former smoker,    Pulmonary exam normal breath sounds clear to auscultation       Cardiovascular hypertension, Pt. on medications  Rhythm:Regular Rate:Normal     Neuro/Psych negative neurological ROS  negative psych ROS   GI/Hepatic negative GI ROS, Neg liver ROS,   Endo/Other  diabetes, Type 2, Oral Hypoglycemic Agents  Renal/GU negative Renal ROS  negative genitourinary   Musculoskeletal   Abdominal   Peds  Hematology negative hematology ROS (+) anemia ,   Anesthesia Other Findings   Reproductive/Obstetrics negative OB ROS                            Anesthesia Physical Anesthesia Plan  ASA: II  Anesthesia Plan: MAC   Post-op Pain Management:    Induction: Intravenous  PONV Risk Score and Plan: 1 and Propofol infusion  Airway Management Planned: Nasal Cannula  Additional Equipment:   Intra-op Plan:   Post-operative Plan: Extubation in OR  Informed Consent: I have reviewed the patients History and Physical, chart, labs and discussed the procedure including the risks, benefits and alternatives for the proposed anesthesia with the patient or authorized representative who has indicated his/her understanding and acceptance.   Dental advisory given  Plan Discussed with: CRNA  Anesthesia Plan Comments:         Anesthesia Quick Evaluation

## 2017-11-05 NOTE — Transfer of Care (Signed)
Immediate Anesthesia Transfer of Care Note  Patient: Camara Rosander [facility]  Procedure(s) Performed: ESOPHAGOGASTRODUODENOSCOPY (EGD) (N/A )  Patient Location: Endoscopy Unit  Anesthesia Type:MAC  Level of Consciousness: drowsy  Airway & Oxygen Therapy: Patient Spontanous Breathing  Post-op Assessment: Report given to RN and Post -op Vital signs reviewed and stable  Post vital signs: Reviewed and stable  Last Vitals:  Vitals:   11/05/17 1249 11/05/17 1250  BP: 138/62   Pulse: 89   Resp: 17   Temp: 36.9 C   SpO2: 98% 99%    Last Pain:  Vitals:   11/05/17 1249  TempSrc: Oral  PainSc:          Complications: No apparent anesthesia complications

## 2017-11-05 NOTE — Anesthesia Procedure Notes (Signed)
Performed by: Roderic Palau, MD Oxygen Delivery Method: Nasal cannula Airway Equipment and Method: Bite block Placement Confirmation: positive ETCO2 Dental Injury: Teeth and Oropharynx as per pre-operative assessment

## 2017-11-05 NOTE — ED Notes (Signed)
Pt Transferred to ENDO for procedure. Report given to Butch Penny, RN.  Blood is infusing and due to end. Documentation to be completed in Endoscopy.

## 2017-11-05 NOTE — ED Notes (Signed)
Soft lunch tray ordered

## 2017-11-06 ENCOUNTER — Other Ambulatory Visit: Payer: Self-pay

## 2017-11-06 ENCOUNTER — Encounter (HOSPITAL_COMMUNITY): Payer: Self-pay | Admitting: General Practice

## 2017-11-06 LAB — TYPE AND SCREEN
ABO/RH(D): A POS
Antibody Screen: NEGATIVE
UNIT DIVISION: 0
Unit division: 0
Unit division: 0

## 2017-11-06 LAB — CBG MONITORING, ED
Glucose-Capillary: 139 mg/dL — ABNORMAL HIGH (ref 65–99)
Glucose-Capillary: 163 mg/dL — ABNORMAL HIGH (ref 65–99)

## 2017-11-06 LAB — GLUCOSE, CAPILLARY
GLUCOSE-CAPILLARY: 111 mg/dL — AB (ref 65–99)
GLUCOSE-CAPILLARY: 217 mg/dL — AB (ref 65–99)
Glucose-Capillary: 155 mg/dL — ABNORMAL HIGH (ref 65–99)

## 2017-11-06 LAB — BPAM RBC
BLOOD PRODUCT EXPIRATION DATE: 201903092359
BLOOD PRODUCT EXPIRATION DATE: 201903092359
Blood Product Expiration Date: 201903022359
ISSUE DATE / TIME: 201902251253
ISSUE DATE / TIME: 201902242201
ISSUE DATE / TIME: 201902251059
UNIT TYPE AND RH: 6200
UNIT TYPE AND RH: 6200
Unit Type and Rh: 600

## 2017-11-06 LAB — FOLATE RBC
Folate, Hemolysate: 356.9 ng/mL
Folate, RBC: 1151 ng/mL (ref >498)
Hematocrit: 31 % — ABNORMAL LOW (ref 37.5–51.0)

## 2017-11-07 DIAGNOSIS — E785 Hyperlipidemia, unspecified: Secondary | ICD-10-CM

## 2017-11-07 DIAGNOSIS — B9681 Helicobacter pylori [H. pylori] as the cause of diseases classified elsewhere: Secondary | ICD-10-CM

## 2017-11-07 DIAGNOSIS — K921 Melena: Secondary | ICD-10-CM

## 2017-11-07 DIAGNOSIS — C61 Malignant neoplasm of prostate: Secondary | ICD-10-CM

## 2017-11-07 DIAGNOSIS — K2980 Duodenitis without bleeding: Secondary | ICD-10-CM

## 2017-11-07 DIAGNOSIS — N400 Enlarged prostate without lower urinary tract symptoms: Secondary | ICD-10-CM

## 2017-11-07 LAB — CBC
HCT: 30.1 % — ABNORMAL LOW (ref 39.0–52.0)
Hemoglobin: 9.4 g/dL — ABNORMAL LOW (ref 13.0–17.0)
MCH: 28.1 pg (ref 26.0–34.0)
MCHC: 31.2 g/dL (ref 30.0–36.0)
MCV: 90.1 fL (ref 78.0–100.0)
PLATELETS: 321 10*3/uL (ref 150–400)
RBC: 3.34 MIL/uL — AB (ref 4.22–5.81)
RDW: 19.5 % — AB (ref 11.5–15.5)
WBC: 6.6 10*3/uL (ref 4.0–10.5)

## 2017-11-07 LAB — GLUCOSE, CAPILLARY
GLUCOSE-CAPILLARY: 145 mg/dL — AB (ref 65–99)
Glucose-Capillary: 133 mg/dL — ABNORMAL HIGH (ref 65–99)
Glucose-Capillary: 134 mg/dL — ABNORMAL HIGH (ref 65–99)
Glucose-Capillary: 162 mg/dL — ABNORMAL HIGH (ref 65–99)

## 2017-11-07 MED ORDER — TETRACYCLINE HCL 250 MG PO CAPS
500.0000 mg | ORAL_CAPSULE | Freq: Four times a day (QID) | ORAL | Status: DC
  Filled 2017-11-07 (×3): qty 2

## 2017-11-07 MED ORDER — METRONIDAZOLE 500 MG PO TABS
500.0000 mg | ORAL_TABLET | Freq: Four times a day (QID) | ORAL | Status: DC
  Administered 2017-11-07: 500 mg via ORAL
  Filled 2017-11-07: qty 1

## 2017-11-07 MED ORDER — BISMUTH SUBSALICYLATE 262 MG PO CHEW
524.0000 mg | CHEWABLE_TABLET | Freq: Four times a day (QID) | ORAL | Status: DC
  Administered 2017-11-07: 524 mg via ORAL
  Filled 2017-11-07 (×3): qty 2

## 2017-11-07 MED ORDER — BISMUTH SUBSALICYLATE 262 MG PO CHEW: 524.0000 mg | CHEWABLE_TABLET | Freq: Four times a day (QID) | ORAL | 0 refills | Status: AC

## 2017-11-07 MED ORDER — INSULIN ASPART 100 UNIT/ML ~~LOC~~ SOLN
0.0000 [IU] | Freq: Three times a day (TID) | SUBCUTANEOUS | Status: DC
  Administered 2017-11-07: 2 [IU] via SUBCUTANEOUS

## 2017-11-07 MED ORDER — TETRACYCLINE HCL 500 MG PO CAPS: 500.0000 mg | ORAL_CAPSULE | Freq: Four times a day (QID) | ORAL | 0 refills | Status: DC

## 2017-11-07 MED ORDER — TETRACYCLINE HCL 250 MG PO CAPS
500.0000 mg | ORAL_CAPSULE | Freq: Four times a day (QID) | ORAL | Status: DC
  Filled 2017-11-07: qty 2

## 2017-11-07 MED ORDER — DOXYCYCLINE HYCLATE 50 MG PO CAPS: 50.0000 mg | ORAL_CAPSULE | Freq: Two times a day (BID) | ORAL | 0 refills | Status: AC

## 2017-11-07 MED ORDER — PANTOPRAZOLE SODIUM 40 MG PO TBEC: 40.0000 mg | DELAYED_RELEASE_TABLET | Freq: Two times a day (BID) | ORAL | 0 refills | Status: DC

## 2017-11-07 MED ORDER — METRONIDAZOLE 500 MG PO TABS: 500.0000 mg | ORAL_TABLET | Freq: Four times a day (QID) | ORAL | 0 refills | Status: AC

## 2017-11-07 NOTE — Progress Notes (Signed)
   11/07/17 0900  Clinical Encounter Type  Visited With Patient  Visit Type Initial  Referral From Chaplain  Consult/Referral To Chaplain  Spiritual Encounters  Spiritual Needs Prayer  Stress Factors  Patient Stress Factors Exhausted  Family Stress Factors None identified    Pt was sitting in his chair watching TV. No family on site but  Pt talked about good family support specifically from wife. Patient also talked about his faith in [place] being a great sustenance. Chaplain provided emotional support through reflective listening, compassionate presence and prayer.  Danny Mccall a Medical sales representative, [employer]

## 2017-11-07 NOTE — Discharge Summary (Signed)
Physician Discharge Summary  Demetres Prochnow [place] YBO:[id number] DOB: Jan 15, 1946 DOA: 11/04/2017  PCP: Bernerd Limbo, MD  Admit date: 11/04/2017 Discharge date: 11/07/2017  Admitted From: Home Disposition:  Home  Recommendations for Outpatient Follow-up:  1. Follow up with PCP in 1 week 2. Follow up with GI Dr. Hilarie Fredrickson in 2-4 weeks  3. 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 He recently had a cold and was tx with some abx, and since that time his stool has been darker.In the ED, they noted black stool per physician but looking at his stool in the comode it appears dark brown. Pt was seen by PCP Coletta Memos) and given stool kit and Hgb 7.1 (11/02/2017). Pt felt more fatigued and had dyspnea on exertion and therefore presented to ED for evaluation. He was admitted for suspected acute GI bleed. GI was consulted and patient was transfused 3u pRBC. He underwent EGD on 2/25 which revealed duodenitis with 1 cratered duodenal ulcer. Biopsy was taken and returned positive for H pylori. He was started on PPI (at least 8 weeks) as well as quadruple therapy (due to allergy to penicillin) with bismuth, metronidazole, and tetracycline for 10 days. On day of discharge, his Hgb was stable at 9.4. He had a bowel movement which was dark brown and lightening up per patient. He had no complaints of chest pain, SOB, N/V, abdominal pain, lightheadedness, dizziness.   Discharge Diagnoses:  Principal Problem:   Melena Active Problems:   HTN (hypertension)   Diabetes (HCC)   Anemia   Heme positive stool   Duodenum ulcer   H. pylori duodenitis   Prostate cancer (HCC)   BPH (benign prostatic hyperplasia)   HLD (hyperlipidemia)    Discharge Instructions  Discharge  Instructions    Call MD for:   Complete by:  As directed    Worsening bleeding in stool   Call MD for:  difficulty breathing, headache or visual disturbances   Complete by:  As directed    Call MD for:  extreme fatigue   Complete by:  As directed    Call MD for:  hives   Complete by:  As directed    Call MD for:  persistant dizziness or light-headedness   Complete by:  As directed    Call MD for:  persistant nausea and vomiting   Complete by:  As directed    Call MD for:  severe uncontrolled pain   Complete by:  As directed    Call MD for:  temperature >100.4   Complete by:  As directed    Diet - low sodium heart healthy   Complete by:  As directed    Discharge instructions   Complete by:  As directed    You were cared for by a hospitalist during your hospital stay. If you have any questions about your discharge medications or the care you received while you were in the hospital after you are discharged, you can call the unit and asked to speak with the hospitalist on call if the hospitalist that took care of you is not available. Once you are discharged, your primary care physician will handle any further medical issues. Please note that NO REFILLS for any discharge medications will be authorized once you are discharged, as it is imperative that you return to  your primary care physician (or establish a relationship with a primary care physician if you do not have one) for your aftercare needs so that they can reassess your need for medications and monitor your lab values.   Discharge instructions   Complete by:  As directed    Avoid aspirin, ibuprofen, naproxen, or other non-steroidal anti-inflammatory drugs.   Increase activity slowly   Complete by:  As directed      Allergies as of 11/07/2017      Reactions   Penicillins Hives      Medication List    STOP taking these medications   aspirin EC 81 MG tablet   HYDROcodone-acetaminophen 5-325 MG tablet Commonly known as:   NORCO/VICODIN   traMADol 50 MG tablet Commonly known as:  ULTRAM     TAKE these medications   atorvastatin 20 MG tablet Commonly known as:  LIPITOR Take 20 mg by mouth daily.   bismuth subsalicylate 712 MG chewable tablet Commonly known as:  PEPTO BISMOL Chew 2 tablets (524 mg total) by mouth 4 (four) times daily for 10 days.   dexamethasone 4 MG tablet Commonly known as:  DECADRON Take 4 mg by mouth See admin instructions. Take 1 tablet by mouth twice daily on the day before and the day after chemo for prevention of fluid retention   gabapentin 100 MG capsule Commonly known as:  NEURONTIN Take 100 mg by mouth 3 (three) times daily.   latanoprost 0.005 % ophthalmic solution Commonly known as:  XALATAN Place 1 drop into the left eye at bedtime.   lisinopril 10 MG tablet Commonly known as:  PRINIVIL,ZESTRIL Take 10 mg by mouth daily.   metroNIDAZOLE 500 MG tablet Commonly known as:  FLAGYL Take 1 tablet (500 mg total) by mouth 4 (four) times daily for 10 days.   oxyCODONE 5 MG immediate release tablet Commonly known as:  Oxy IR/ROXICODONE Take 5 mg by mouth every 4 (four) hours as needed for severe pain.   pantoprazole 40 MG tablet Commonly known as:  PROTONIX Take 1 tablet (40 mg total) by mouth 2 (two) times daily before a meal. What changed:    medication strength  how much to take  when to take this   prednisoLONE acetate 1 % ophthalmic suspension Commonly known as:  PRED FORTE Place 1 drop into the right eye 2 (two) times daily.   prochlorperazine 5 MG tablet Commonly known as:  COMPAZINE Take 5 mg by mouth every 6 (six) hours as needed for nausea or vomiting.   SIMBRINZA 1-0.2 % Susp Generic drug:  Brinzolamide-Brimonidine Place 1 drop into the right eye 2 (two) times daily.   sitaGLIPtin-metformin 50-1000 MG tablet Commonly known as:  JANUMET Take 1 tablet by mouth daily.   tamsulosin 0.4 MG Caps capsule Commonly known as:  FLOMAX Take 0.4 mg  by mouth daily.   tetracycline 500 MG capsule Commonly known as:  ACHROMYCIN,SUMYCIN Take 1 capsule (500 mg total) by mouth 4 (four) times daily for 10 days.   timolol 0.5 % ophthalmic solution Commonly known as:  BETIMOL Place 1 drop into the right eye 2 (two) times daily.   valACYclovir 1000 MG tablet Commonly known as:  VALTREX Take 1,000 mg by mouth 3 (three) times daily.      Follow-up Information    Bernerd Limbo, MD. Schedule an appointment as soon as possible for a visit in 1 week(s).   Specialty:  Family Medicine Contact information: [address]  [place] [address] [phone]        Jerene Bears, MD. Schedule an appointment as soon as possible for a visit in 2 week(s).   Specialty:  Gastroenterology Contact information: [address] [address] [phone]          Allergies  Allergen Reactions  . Penicillins Hives    Consultations:  GI   Procedures/Studies: No results found.  EGD 2/25 Impression:       - Normal esophagus.                           - Normal stomach. Biopsied to rule out H. Pylori                            given duodenal ulcer disease.                           - Duodenitis with 1 cratered duodenal ulcer with no                            stigmata of bleeding.                           - Normal second portion of the duodenum.   Discharge Exam: Vitals:   11/07/17 0823 11/07/17 1231  BP: (!) 144/79 131/79  Pulse:    Resp:    Temp:  99.6 F (37.6 C)  SpO2: 98% 100%   Vitals:   11/07/17 0100 11/07/17 0415 11/07/17 0823 11/07/17 1231  BP: 140/73 (!) 147/73 (!) 144/79 131/79  Pulse: 85 80    Resp: 16 18    Temp:  98.4 F (36.9 C)  99.6 F (37.6 C)  TempSrc:  Oral  Oral  SpO2: 100% 98% 98% 100%  Weight:  107.1 kg (236 lb 1.6 oz)    Height:        General: Pt is alert, awake, not in acute distress Cardiovascular: RRR, S1/S2 +, no rubs, no gallops, trace edema of LE  Respiratory:  CTA bilaterally, no wheezing, no rhonchi Abdominal: Soft, NT, ND, bowel sounds + Extremities: no edema, no cyanosis    The results of significant diagnostics from this hospitalization (including imaging, microbiology, ancillary and laboratory) are listed below for reference.     Microbiology: No results found for this or any previous visit (from the past 240 hour(s)).   Labs: BNP (last 3 results) No results for input(s): BNP in the last 8760 hours. Basic Metabolic Panel: Recent Labs  Lab 11/04/17 2018 11/05/17 0338  NA 139 141  K 3.9 3.6  CL 104 110  CO2 25 25  GLUCOSE 158* 159*  BUN 7 6  CREATININE 0.88 0.79  CALCIUM 8.2* 7.8*   Liver Function Tests: Recent Labs  Lab 11/04/17 2018 11/05/17 0338  AST 21 21  ALT 10* 9*  ALKPHOS 66 65  BILITOT 0.6 0.9  PROT 5.9* 5.1*  ALBUMIN 3.2* 2.8*   No results for input(s): LIPASE, AMYLASE in the last 168 hours. No results for input(s): AMMONIA in the last 168 hours. CBC: Recent Labs  Lab 11/04/17 2018 11/05/17 0300 11/05/17 0338 11/05/17 1715 11/07/17 1142  WBC 7.1  --  5.4 6.6 6.6  HGB 6.3*  --  6.3* 8.7* 9.4*  HCT 20.1*  31.0* 20.0* 28.0* 30.1*  MCV 88.5  --  87.0 87.5 90.1  PLT 321  --  302 304 321   Cardiac Enzymes: No results for input(s): CKTOTAL, CKMB, CKMBINDEX, TROPONINI in the last 168 hours. BNP: Invalid input(s): POCBNP CBG: Recent Labs  Lab 11/06/17 2233 11/07/17 0111 11/07/17 0419 11/07/17 0806 11/07/17 1126  GLUCAP 155* 145* 133* 134* 162*   D-Dimer No results for input(s): DDIMER in the last 72 hours. Hgb A1c No results for input(s): HGBA1C in the last 72 hours. Lipid Profile No results for input(s): CHOL, HDL, LDLCALC, TRIG, CHOLHDL, LDLDIRECT in the last 72 hours. Thyroid function studies No results for input(s): TSH, T4TOTAL, T3FREE, THYROIDAB in the last 72 hours.  Invalid input(s): FREET3 Anemia work up Recent Labs    11/05/17 0338  VITAMINB12 294  FERRITIN 79  TIBC 216*   IRON 67   Urinalysis    Component Value Date/Time   COLORURINE YELLOW 08/01/2009 [place] 08/01/2009 1648   LABSPEC 1.010 01/03/2017 1742   PHURINE 7.0 01/03/2017 1742   GLUCOSEU NEGATIVE 01/03/2017 1742   HGBUR NEGATIVE 01/03/2017 1742   BILIRUBINUR NEGATIVE 01/03/2017 1742   KETONESUR NEGATIVE 01/03/2017 1742   PROTEINUR NEGATIVE 01/03/2017 1742   UROBILINOGEN 0.2 01/03/2017 1742   NITRITE NEGATIVE 01/03/2017 1742   LEUKOCYTESUR NEGATIVE 01/03/2017 1742   Sepsis Labs Invalid input(s): PROCALCITONIN,  WBC,  LACTICIDVEN Microbiology No results found for this or any previous visit (from the past 240 hour(s)).   Patient was seen and examined on the day of discharge and was found to be in stable condition. Time coordinating discharge: 30 minutes including assessment and coordination of care, as well as examination of the patient.   SIGNED:  Dessa Phi, DO [facility] Pager [phone]  If 7PM-7AM, please contact night-coverage [email or web address] Password [email or web address] 11/07/2017, 1:40 PM

## 2017-11-07 NOTE — Discharge Instructions (Signed)
Helicobacter Pylori Infection Helicobacter pylori infection is an infection in the stomach that is caused by the Helicobacter pylori (H. pylori) bacteria. This type of bacteria often lives in the lining of the stomach. The infection can cause ulcers and irritation (gastritis) in some people. It is the most common cause of ulcers in the stomach (gastric ulcer) and in the upper part of the intestine (duodenal ulcer). Having this infection may also increase the risk of stomach cancer and a type of white blood cell cancer (lymphoma) that affects the stomach. What are the causes? H. pylori is a type of bacteria that is often found in the stomachs of healthy people. The bacteria may be passed from person to person through contact with stool or saliva. It is not known why some people develop ulcers, gastritis, or cancer from the infection. What increases the risk? This condition is more likely to develop in people who:  Have family members with the infection.  Live with many other people, such as in a dormitory.  Are of African, Hispanic, or Asian descent.  What are the signs or symptoms? Most people with this infection do not have symptoms. If you do have symptoms, they may include:  Heartburn.  Stomach pain.  Nausea.  Vomiting.  Blood-tinged vomit.  Loss of appetite.  Bad breath.  How is this diagnosed? This condition may be diagnosed based on your symptoms, a physical exam, and various tests. Tests may include:  Blood tests or stool tests to check for the proteins (antibodies) that your body may produce in response to the bacteria. These tests are the best way to confirm the diagnosis.  A breath test to check for the type of gas that the H. pylori bacteria release after breaking down a substance called urea. For the test, you are asked to drink urea. This test is often done after treatment in order to find out if the treatment worked.  A procedure in which a thin, flexible tube with  a tiny camera at the end is placed into your stomach and upper intestine (upper endoscopy). Your health care provider may also take tissue samples (biopsy) to test for H. pylori and cancer.  How is this treated? Treatment for this condition usually involves taking a combination of medicines (triple therapy) for a couple of weeks. Triple therapy includes one medicine to reduce the acid in your stomach and two types of antibiotic medicines. Many drug combinations have been approved for treatment. Treatment usually kills the H. pylori and reduces your risk of cancer. You may need to be tested for H. pylori again after treatment. In some cases, the treatment may need to be repeated. Follow these instructions at home:  Take over-the-counter and prescription medicines only as told by your health care provider.  Take your antibiotics as told by your health care provider. Do not stop taking the antibiotics even if you start to feel better.  You can do all your usual activities and eat what you usually do.  Take steps to prevent future infections: ? Wash your hands often. ? Make sure the food you eat has been properly prepared. ? Drink water only from clean sources.  Keep all follow-up visits as told by your health care provider. This is important. Contact a health care provider if:  Your symptoms do not get better.  Your symptoms return after treatment. This information is not intended to replace advice given to you by your health care provider. Make sure you discuss any questions  you have with your health care provider. Document Released: 12/20/2015 Document Revised: 02/03/2016 Document Reviewed: 09/09/2014 Elsevier Interactive Patient Education  2018 [employer].      Gastrointestinal Bleeding Gastrointestinal bleeding is bleeding somewhere along the path food travels through the body (digestive tract). This path is anywhere between the mouth and the opening of the butt (anus). You may  have blood in your poop (stools) or have black poop. If you throw up (vomit), there may be blood in it. This condition can be mild, serious, or even life-threatening. If you have a lot of bleeding, you may need to stay in the hospital. Follow these instructions at home:  Take over-the-counter and prescription medicines only as told by your doctor.  Eat foods that have a lot of fiber in them. These foods include whole grains, fruits, and vegetables. You can also try eating 1-3 prunes each day.  Drink enough fluid to keep your pee (urine) clear or pale yellow.  Keep all follow-up visits as told by your doctor. This is important. Contact a doctor if:  Your symptoms do not get better. Get help right away if:  Your bleeding gets worse.  You feel dizzy or you pass out (faint).  You feel weak.  You have very bad cramps in your back or belly (abdomen).  You pass large clumps of blood (clots) in your poop.  Your symptoms are getting worse. This information is not intended to replace advice given to you by your health care provider. Make sure you discuss any questions you have with your health care provider. Document Released: 06/06/2008 Document Revised: 02/03/2016 Document Reviewed: 02/15/2015 Elsevier Interactive Patient Education  2018 [employer].

## 2017-11-07 NOTE — Evaluation (Signed)
Physical Therapy Evaluation Patient Details Name: Danny Mccall MRN: [id number] DOB: 10-Jan-1946 Today's Date: 11/07/2017   History of Present Illness  Pt is a 72 y/o male admited secondary to SOB and dark stools. Found to have anemia. HAd EGD on 2/25 which revealed duodenal ulcer. PMH inlucdes prostate cancer with metastasis to bone (currently undergoing chemo), HTN, and DM.   Clinical Impression  Pt admitted secondary to problem above with deficits below. Slightly unsteady, however, tolerated gait training well. Required min guard A for mobility this session. Educated about generalized exercise program to perform at home. Will continue to follow acutely to maximize functional mobility independence and safety.     Follow Up Recommendations No PT follow up    Equipment Recommendations  None recommended by PT    Recommendations for Other Services       Precautions / Restrictions Precautions Precautions: None Restrictions Weight Bearing Restrictions: No      Mobility  Bed Mobility               General bed mobility comments: In chair upon entry.   Transfers Overall transfer level: Needs assistance Equipment used: None Transfers: Sit to/from Stand Sit to Stand: Min guard         General transfer comment: Min guard for safety.   Ambulation/Gait Ambulation/Gait assistance: Min guard Ambulation Distance (Feet): 200 Feet Assistive device: None Gait Pattern/deviations: Step-through pattern;Decreased stride length Gait velocity: Decreased  Gait velocity interpretation: Below normal speed for age/gender General Gait Details: Slow, cautious gait. Slightly unsteady, however, no LOB noted. Reporting stiffness in LEs. Educated about use of walking program at home to help decrease stiffness at home.   Stairs Stairs: Yes Stairs assistance: Min guard Stair Management: One rail Right;Alternating pattern;Step to pattern;Forwards Number of Stairs: 10 General stair  comments: Slow, cautious stair navigation. Min guard for steadying assist. Use of alternating pattern for ascending stairs and educated/practiced use of step to pattern for desdencing steps to increase safety.   Wheelchair Mobility    Modified Rankin (Stroke Patients Only)       Balance Overall balance assessment: Needs assistance Sitting-balance support: No upper extremity supported;Feet supported Sitting balance-Leahy Scale: Good     Standing balance support: No upper extremity supported;During functional activity Standing balance-Leahy Scale: Fair                               Pertinent Vitals/Pain Pain Assessment: No/denies pain    Home Living Family/patient expects to be discharged to:: Private residence Living Arrangements: Spouse/significant other;Other relatives(grandson) Available Help at Discharge: Family;Available 24 hours/day Type of Home: House Home Access: Stairs to enter Entrance Stairs-Rails: Right Entrance Stairs-Number of Steps: 5 Home Layout: Multi-level(split level ) Home Equipment: Cane - single point      Prior Function Level of Independence: Independent with assistive device(s)         Comments: Used cane for outdoor distances      Hand Dominance   Dominant Hand: Right    Extremity/Trunk Assessment   Upper Extremity Assessment Upper Extremity Assessment: Overall WFL for tasks assessed    Lower Extremity Assessment Lower Extremity Assessment: RLE deficits/detail;LLE deficits/detail;Generalized weakness RLE Deficits / Details: Reports stiffness in RLE RLE Sensation: history of peripheral neuropathy LLE Deficits / Details: Reports stiffness in LLE.  LLE Sensation: history of peripheral neuropathy    Cervical / Trunk Assessment Cervical / Trunk Assessment: Normal  Communication   Communication: No difficulties  Cognition Arousal/Alertness: Awake/alert Behavior During Therapy: WFL for tasks assessed/performed Overall  Cognitive Status: Within Functional Limits for tasks assessed                                        General Comments General comments (skin integrity, edema, etc.): Asking about glucose machine for home use. Notified RN and case management.     Exercises Other Exercises Other Exercises: Educated about generalized LE exercise program to help decrease stiffness.    Assessment/Plan    PT Assessment Patient needs continued PT services  PT Problem List Decreased strength;Decreased balance;Decreased mobility       PT Treatment Interventions Gait training;Stair training;Functional mobility training;Therapeutic activities;Therapeutic exercise;Balance training;Neuromuscular re-education;Patient/family education    PT Goals (Current goals can be found in the Care Plan section)  Acute Rehab PT Goals Patient Stated Goal: to go home  PT Goal Formulation: With patient Time For Goal Achievement: 11/21/17 Potential to Achieve Goals: Good    Frequency Min 3X/week   Barriers to discharge        Co-evaluation               AM-PAC PT "6 Clicks" Daily Activity  Outcome Measure Difficulty turning over in bed (including adjusting bedclothes, sheets and blankets)?: A Little Difficulty moving from lying on back to sitting on the side of the bed? : A Little Difficulty sitting down on and standing up from a chair with arms (e.g., wheelchair, bedside commode, etc,.)?: Unable Help needed moving to and from a bed to chair (including a wheelchair)?: A Little Help needed walking in hospital room?: A Little Help needed climbing 3-5 steps with a railing? : A Little 6 Click Score: 16    End of Session Equipment Utilized During Treatment: Gait belt Activity Tolerance: Patient tolerated treatment well Patient left: in chair;with call bell/phone within reach Nurse Communication: Mobility status;Other (comment)(pt asking about glucose machine ) PT Visit Diagnosis: Other  abnormalities of gait and mobility (R26.89);Muscle weakness (generalized) (M62.81)    Time: 8144-8185 PT Time Calculation (min) (ACUTE ONLY): 17 min   Charges:   PT Evaluation $PT Eval Low Complexity: 1 Low     PT G Codes:        Leighton Ruff, PT, DPT  Acute Rehabilitation Services  Pager: [phone]   Rudean Hitt 11/07/2017, 11:12 AM

## 2017-11-07 NOTE — Progress Notes (Signed)
Discharge instruction was given to pt.  Pt is awaiting for his ride.  Idolina Primer, RN

## 2017-11-08 ENCOUNTER — Encounter (HOSPITAL_COMMUNITY): Payer: Self-pay | Admitting: Internal Medicine

## 2017-11-09 ENCOUNTER — Other Ambulatory Visit: Payer: Self-pay

## 2017-11-09 DIAGNOSIS — A048 Other specified bacterial intestinal infections: Secondary | ICD-10-CM

## 2017-11-29 ENCOUNTER — Ambulatory Visit: Payer: Medicare HMO | Admitting: Nurse Practitioner

## 2018-01-07 ENCOUNTER — Encounter: Payer: Self-pay | Admitting: Nurse Practitioner

## 2018-01-07 ENCOUNTER — Ambulatory Visit (INDEPENDENT_AMBULATORY_CARE_PROVIDER_SITE_OTHER): Payer: Medicare HMO | Admitting: Nurse Practitioner

## 2018-01-07 VITALS — BP 130/74 | HR 76 | Ht 71.0 in | Wt 222.2 lb

## 2018-01-07 DIAGNOSIS — K279 Peptic ulcer, site unspecified, unspecified as acute or chronic, without hemorrhage or perforation: Secondary | ICD-10-CM | POA: Diagnosis not present

## 2018-01-07 DIAGNOSIS — B9681 Helicobacter pylori [H. pylori] as the cause of diseases classified elsewhere: Secondary | ICD-10-CM

## 2018-01-07 DIAGNOSIS — K269 Duodenal ulcer, unspecified as acute or chronic, without hemorrhage or perforation: Secondary | ICD-10-CM

## 2018-01-07 NOTE — Patient Instructions (Addendum)
If you are age 70 or older, your body mass index should be between 23-30. Your Body mass index is 30.99 kg/m. If this is out of the aforementioned range listed, please consider follow up with your Primary Care Provider.  If you are age 45 or younger, your body mass index should be between 19-25. Your Body mass index is 30.99 kg/m. If this is out of the aformentioned range listed, please consider follow up with your Primary Care Provider.   Your provider has requested that you go to the basement level for lab work before leaving today. Press "B" on the elevator. The lab is located at the first door on the left as you exit the elevator. CBC Do stool studies in two weeks.  Get container from lab today.  STOP PROTONIX.  Obtain colonoscopy records.   Thank you for choosing me and [facility].   Tye Savoy, NP

## 2018-01-07 NOTE — Progress Notes (Addendum)
Chief Complaint: Hospital follow-up   ASSESSMENT AND PLAN;   72 yo male with recent admission for severe anemia, FOBT positive stool . Inpatient EGD by Dr. Hilarie Fredrickson >>> duodenitis / duodenal ulcer without stigmata of recent bleeding but likely culprit lesion. Biopsies H.pylori positive.  - Patient poor historian, says he took 10 days of quadruple therapy but at day #10 still had several pills leftover (one of the antibiotics?).    -Check H.pylori stool antigen to ensure eradication. He will check this in 2-3 weeks off PPI. If positive he may need another course of antibiotics as it seems it didn't complete it all. since  -Hgb rose to 9.4 from 6.3 following 3 units of blood during late Feb admission.  Check CBC today to make sure hgb stable. -Patient apparently had a colonoscopy at some point, cannot remember where or when.  He will try to track down records and get them to [place]. May have been done in [place].  Patient was distracted today.  His 41 year old grandson is in the car with a sickle cell crisis and patient trying to get him some help  2. Metastatic prostate cancer, s/p radiation seed therapy and now undergoing chemo  3. Colon cancer screening. Patient unable to remember exactly when /where he had a colonoscopy. It may have been done in [place] ~ 5 years ago, he will try and get  the report to [place].  Addendum: Reviewed and agree with management. Pyrtle, Lajuan Lines, MD    HPI:     Patient is a 73 year old male who we saw for the first time during late February when patient was hospitalized with GI bleed. He presented with dyspnea, fatigue, loose, dark stools and hgb of 6.3, down from 10.17 Apr 2017. He had not been taking NSAIDS.  MCV was normal, ferritin was normal at 79.  Inpatient EGD remarkable for duodenitis and a cratered duodenal ulcer.  Biopsies were positive for H pylori.  Patient was started on quadruple therapy at discharge. Hgb at discharge, following 3 units of blood was 9.4,  up from 6.3.   Patient was unable to make his original hospital follow-up appointment so rescheduled today.  He took 10 days of antibiotics + PPI but at the end of the 10 days still had several left of one of the antibiotics ( or at least he thinks it was an antibiotic). He feels well. No abdominal pain. No overt GI bleeding.      Past Medical History:  Diagnosis Date  . Anemia   . Diabetes mellitus without complication ([place])   . Duodenal ulcer 10/2017  . Hypertension   . Prostate cancer ([place])    Gleason 9    Past Surgical History:  Procedure Laterality Date  . ABDOMINAL SURGERY    . CHOLECYSTECTOMY    . ESOPHAGOGASTRODUODENOSCOPY N/A 11/05/2017   Procedure: ESOPHAGOGASTRODUODENOSCOPY (EGD);  Surgeon: Jerene Bears, MD;  Location: [facility] ENDOSCOPY;  Service: Gastroenterology;  Laterality: N/A;  . EYE SURGERY Right    Family History  Problem Relation Age of Onset  . Diabetes Mother   . Cancer Father        throat  . Colon cancer Other        sister's child   Social History   Tobacco Use  . Smoking status: Former Smoker    Packs/day: 0.50    Types: Cigarettes    Last attempt to quit: 11/09/2016    Years since quitting: 1.1  . Smokeless tobacco: Never  Used  Substance Use Topics  . Alcohol use: Yes    Comment: rarely  . Drug use: No   Current Outpatient Medications  Medication Sig Dispense Refill  . atorvastatin (LIPITOR) 20 MG tablet Take 20 mg by mouth daily.    Marland Kitchen dexamethasone (DECADRON) 4 MG tablet Take 4 mg by mouth See admin instructions. Take 1 tablet by mouth twice daily on the day before and the day after chemo for prevention of fluid retention    . gabapentin (NEURONTIN) 100 MG capsule Take 100 mg by mouth 3 (three) times daily.    Marland Kitchen latanoprost (XALATAN) 0.005 % ophthalmic solution Place 1 drop into the left eye at bedtime.    Marland Kitchen lisinopril (PRINIVIL,ZESTRIL) 10 MG tablet Take 10 mg by mouth daily.    Marland Kitchen oxyCODONE (OXY IR/ROXICODONE) 5 MG immediate release tablet  Take 5 mg by mouth every 4 (four) hours as needed for severe pain.    . prednisoLONE acetate (PRED FORTE) 1 % ophthalmic suspension Place 1 drop into the right eye 2 (two) times daily.  1  . prochlorperazine (COMPAZINE) 5 MG tablet Take 5 mg by mouth every 6 (six) hours as needed for nausea or vomiting.    Marland Kitchen SIMBRINZA 1-0.2 % SUSP Place 1 drop into the right eye 2 (two) times daily.  4  . sitaGLIPtin-metformin (JANUMET) 50-1000 MG tablet Take 1 tablet by mouth daily.    . tamsulosin (FLOMAX) 0.4 MG CAPS capsule Take 0.4 mg by mouth daily.    . timolol (BETIMOL) 0.5 % ophthalmic solution Place 1 drop into the right eye 2 (two) times daily.    . valACYclovir (VALTREX) 1000 MG tablet Take 1,000 mg by mouth 3 (three) times daily.    . pantoprazole (PROTONIX) 40 MG tablet Take 1 tablet (40 mg total) by mouth 2 (two) times daily before a meal. 112 tablet 0   No current facility-administered medications for this visit.    Allergies  Allergen Reactions  . Penicillins Hives     Review of Systems: All systems reviewed and negative except where noted in HPI.    Physical Exam:    Wt Readings from Last 3 Encounters:  01/07/18 222 lb 3.2 oz (100.8 kg)  11/07/17 236 lb 1.6 oz (107.1 kg)  07/16/16 222 lb (100.7 kg)    BP 130/74   Pulse 76   Ht 5\' 11"  (1.803 m)   Wt 222 lb 3.2 oz (100.8 kg)   BMI 30.99 kg/m  Constitutional:  Well-developed, black male in no acute distress. Psychiatric: Normal mood and affect. Behavior is normal. EENT: Pupils normal.  Conjunctivae are normal. No scleral icterus. Neck supple.  Cardiovascular: Normal rate, regular rhythm. No edema Pulmonary/chest: Effort normal and breath sounds normal. No wheezing, rales or rhonchi. Abdominal: Soft, nondistended. Nontender. Bowel sounds active throughout. There are no masses palpable.  Neurological: Alert and oriented to person place and time. Skin: Skin is warm and dry. No rashes noted.  Tye Savoy, NP  01/07/2018,  9:45 AM  Cc: Bernerd Limbo, MD

## 2018-01-08 ENCOUNTER — Encounter: Payer: Self-pay | Admitting: Nurse Practitioner

## 2018-01-14 ENCOUNTER — Other Ambulatory Visit: Payer: Self-pay

## 2018-03-22 ENCOUNTER — Other Ambulatory Visit: Payer: Self-pay | Admitting: Family Medicine

## 2018-03-22 DIAGNOSIS — Z136 Encounter for screening for cardiovascular disorders: Secondary | ICD-10-CM

## 2018-04-04 ENCOUNTER — Ambulatory Visit: Payer: Medicare HMO

## 2019-05-04 ENCOUNTER — Emergency Department (HOSPITAL_COMMUNITY): Payer: Medicare HMO

## 2019-05-04 ENCOUNTER — Encounter (HOSPITAL_COMMUNITY): Payer: Self-pay | Admitting: *Deleted

## 2019-05-04 ENCOUNTER — Emergency Department (HOSPITAL_COMMUNITY)
Admission: EM | Admit: 2019-05-04 | Discharge: 2019-05-05 | Disposition: A | Discharge status: Home / Self Care | Payer: Medicare HMO | Attending: Emergency Medicine | Admitting: Emergency Medicine

## 2019-05-04 ENCOUNTER — Other Ambulatory Visit: Payer: Self-pay

## 2019-05-04 DIAGNOSIS — K5903 Drug induced constipation: Secondary | ICD-10-CM | POA: Diagnosis not present

## 2019-05-04 DIAGNOSIS — I10 Essential (primary) hypertension: Secondary | ICD-10-CM | POA: Diagnosis not present

## 2019-05-04 DIAGNOSIS — M545 Low back pain, unspecified: Secondary | ICD-10-CM

## 2019-05-04 DIAGNOSIS — Z87891 Personal history of nicotine dependence: Secondary | ICD-10-CM | POA: Diagnosis not present

## 2019-05-04 DIAGNOSIS — Z79899 Other long term (current) drug therapy: Secondary | ICD-10-CM | POA: Diagnosis not present

## 2019-05-04 DIAGNOSIS — M79604 Pain in right leg: Secondary | ICD-10-CM

## 2019-05-04 DIAGNOSIS — E119 Type 2 diabetes mellitus without complications: Secondary | ICD-10-CM | POA: Insufficient documentation

## 2019-05-04 DIAGNOSIS — C61 Malignant neoplasm of prostate: Secondary | ICD-10-CM

## 2019-05-04 LAB — CBC WITH DIFFERENTIAL/PLATELET
Abs Immature Granulocytes: 0.02 10*3/uL (ref 0.00–0.07)
Basophils Absolute: 0 10*3/uL (ref 0.0–0.1)
Basophils Relative: 0 %
Eosinophils Absolute: 0 10*3/uL (ref 0.0–0.5)
Eosinophils Relative: 0 %
HCT: 32.4 % — ABNORMAL LOW (ref 39.0–52.0)
Hemoglobin: 10.2 g/dL — ABNORMAL LOW (ref 13.0–17.0)
Immature Granulocytes: 0 %
Lymphocytes Relative: 18 %
Lymphs Abs: 0.9 10*3/uL (ref 0.7–4.0)
MCH: 28.5 pg (ref 26.0–34.0)
MCHC: 31.5 g/dL (ref 30.0–36.0)
MCV: 90.5 fL (ref 80.0–100.0)
Monocytes Absolute: 0.4 10*3/uL (ref 0.1–1.0)
Monocytes Relative: 8 %
Neutro Abs: 3.8 10*3/uL (ref 1.7–7.7)
Neutrophils Relative %: 74 %
Platelets: 318 10*3/uL (ref 150–400)
RBC: 3.58 MIL/uL — ABNORMAL LOW (ref 4.22–5.81)
RDW: 15.8 % — ABNORMAL HIGH (ref 11.5–15.5)
WBC: 5.2 10*3/uL (ref 4.0–10.5)
nRBC: 0 % (ref 0.0–0.2)

## 2019-05-04 LAB — BASIC METABOLIC PANEL
Anion gap: 10 (ref 5–15)
BUN: 8 mg/dL (ref 8–23)
CO2: 22 mmol/L (ref 22–32)
Calcium: 8 mg/dL — ABNORMAL LOW (ref 8.9–10.3)
Chloride: 107 mmol/L (ref 98–111)
Creatinine, Ser: 0.76 mg/dL (ref 0.61–1.24)
GFR calc Af Amer: >60 mL/min (ref >60)
GFR calc non Af Amer: >60 mL/min (ref >60)
Glucose, Bld: 178 mg/dL — ABNORMAL HIGH (ref 70–99)
Potassium: 3 mmol/L — ABNORMAL LOW (ref 3.5–5.1)
Sodium: 139 mmol/L (ref 135–145)

## 2019-05-04 MED ORDER — MORPHINE SULFATE (PF) 4 MG/ML IV SOLN
4.0000 mg | Freq: Once | INTRAVENOUS | Status: AC
  Administered 2019-05-04: 4 mg via INTRAVENOUS
  Filled 2019-05-04: qty 1

## 2019-05-04 MED ORDER — SODIUM CHLORIDE (PF) 0.9 % IJ SOLN
INTRAMUSCULAR | Status: AC
  Administered 2019-05-04: 1 mL
  Filled 2019-05-04: qty 50

## 2019-05-04 MED ORDER — IOHEXOL 300 MG/ML  SOLN
100.0000 mL | Freq: Once | INTRAMUSCULAR | Status: AC | PRN
  Administered 2019-05-04: 100 mL via INTRAVENOUS

## 2019-05-04 NOTE — ED Notes (Signed)
Attempted IV x 2. Unsuccessful. 

## 2019-05-04 NOTE — ED Provider Notes (Signed)
[facility] DEPT Provider Note   CSN: [id number] Arrival date & time: 05/04/19  1807     History   Chief Complaint Chief Complaint  Patient presents with  . Leg Pain    Rt    HPI Danny Mccall is a 73 y.o. male.  Past medical history of prostate cancer, currently undergoing oral chemotherapy agent.  Patient reports that he has had severe right lower back, right hip and right leg pain.  Pain is worse in his hip and upper leg.  Described as a sharp shooting pain, up to 10 out of 10 in severity.  Has been taking his at home oxycodone and oral morphine without significant relief.  States he was supposed to have CT scan scheduled as outpatient for assessment of metastases but has not had this completed yet.     HPI  Past Medical History:  Diagnosis Date  . Anemia   . Diabetes mellitus without complication ([place])   . Duodenal ulcer 10/2017  . Hypertension   . Prostate cancer [facility])    Gleason 9    Patient Active Problem List   Diagnosis Date Noted  . Melena 11/07/2017  . H. pylori duodenitis 11/07/2017  . Prostate cancer ([place]) 11/07/2017  . BPH (benign prostatic hyperplasia) 11/07/2017  . HLD (hyperlipidemia) 11/07/2017  . Heme positive stool   . Duodenum ulcer   . Anemia 11/04/2017  . HTN (hypertension) 07/30/2013  . Diabetes ([place]) 07/30/2013    Past Surgical History:  Procedure Laterality Date  . ABDOMINAL SURGERY    . CHOLECYSTECTOMY    . ESOPHAGOGASTRODUODENOSCOPY N/A 11/05/2017   Procedure: ESOPHAGOGASTRODUODENOSCOPY (EGD);  Surgeon: Jerene Bears, MD;  Location: [facility] ENDOSCOPY;  Service: Gastroenterology;  Laterality: N/A;  . EYE SURGERY Right         Home Medications    Prior to Admission medications   Medication Sig Start Date End Date Taking? Authorizing Provider  atorvastatin (LIPITOR) 20 MG tablet Take 20 mg by mouth daily.    [provider]  dexamethasone (DECADRON) 4 MG tablet Take 4 mg by mouth See  admin instructions. Take 1 tablet by mouth twice daily on the day before and the day after chemo for prevention of fluid retention    [provider]  gabapentin (NEURONTIN) 100 MG capsule Take 100 mg by mouth 3 (three) times daily.    [provider]  latanoprost (XALATAN) 0.005 % ophthalmic solution Place 1 drop into the left eye at bedtime.    [provider]  lisinopril (PRINIVIL,ZESTRIL) 10 MG tablet Take 10 mg by mouth daily.    [provider]  oxyCODONE (OXY IR/ROXICODONE) 5 MG immediate release tablet Take 5 mg by mouth every 4 (four) hours as needed for severe pain.    [provider]  pantoprazole (PROTONIX) 40 MG tablet Take 1 tablet (40 mg total) by mouth 2 (two) times daily before a meal. 11/07/17 01/02/18  Dessa Phi, DO  prednisoLONE acetate (PRED FORTE) 1 % ophthalmic suspension Place 1 drop into the right eye 2 (two) times daily. 10/25/17   [provider]  prochlorperazine (COMPAZINE) 5 MG tablet Take 5 mg by mouth every 6 (six) hours as needed for nausea or vomiting.    [provider]  SIMBRINZA 1-0.2 % SUSP Place 1 drop into the right eye 2 (two) times daily. 08/24/17   [provider]  sitaGLIPtin-metformin (JANUMET) 50-1000 MG tablet Take 1 tablet by mouth daily.    [provider]  tamsulosin (FLOMAX) 0.4 MG CAPS capsule Take 0.4 mg by mouth daily.    [provider]  timolol (BETIMOL) 0.5 % ophthalmic solution Place 1 drop into the right eye 2 (two) times daily.    [provider]  valACYclovir (VALTREX) 1000 MG tablet Take 1,000 mg by mouth 3 (three) times daily.    [provider]    Family History Family History  Problem Relation Age of Onset  . Diabetes Mother   . Cancer Father        throat  . Colon cancer Other        sister's child    Social History Social History   Tobacco Use  . Smoking status: Former Smoker    Packs/day: 0.50    Types:  Cigarettes    Quit date: 11/09/2016    Years since quitting: 2.4  . Smokeless tobacco: Never Used  Substance Use Topics  . Alcohol use: Yes    Comment: rarely  . Drug use: No     Allergies   Penicillins   Review of Systems Review of Systems  Constitutional: Negative for chills and fever.  HENT: Negative for ear pain and sore throat.   Eyes: Negative for pain and visual disturbance.  Respiratory: Negative for cough and shortness of breath.   Cardiovascular: Negative for chest pain and palpitations.  Gastrointestinal: Negative for abdominal pain and vomiting.  Genitourinary: Negative for dysuria and hematuria.  Musculoskeletal: Positive for arthralgias, back pain and myalgias.  Skin: Negative for color change and rash.  Neurological: Negative for seizures and syncope.  All other systems reviewed and are negative.    Physical Exam Updated Vital Signs BP 123/74 (BP Location: Left Arm)   Pulse 87   Temp 98.5 F (36.9 C) (Oral)   Resp 18   SpO2 98%   Physical Exam Vitals signs and nursing note reviewed.  Constitutional:      Appearance: He is well-developed.  HENT:     Head: Normocephalic and atraumatic.  Eyes:     Conjunctiva/sclera: Conjunctivae normal.  Neck:     Musculoskeletal: Neck supple.  Cardiovascular:     Rate and Rhythm: Normal rate and regular rhythm.     Heart sounds: No murmur.  Pulmonary:     Effort: Pulmonary effort is normal. No respiratory distress.     Breath sounds: Normal breath sounds.  Abdominal:     Palpations: Abdomen is soft.     Tenderness: There is no abdominal tenderness.  Musculoskeletal:     Comments: No tenderness palpation T, L-spine, there is tenderness palpation of her right hip, right thigh, right knee, no obvious deformity over her spine, lower extremities, 5 out of 5 strength in right leg, sensation to light touch intact in right leg, distal pulses and cap refill intact  Skin:    General: Skin is warm and dry.   Neurological:     Mental Status: He is alert.      ED Treatments / Results  Labs (all labs ordered are listed, but only abnormal results are displayed) Labs Reviewed - No data to display  EKG None  Radiology No results found.  Procedures Procedures (including critical care time)  Medications Ordered in ED Medications - No data to display   Initial Impression / Assessment and Plan / ED Course  I have reviewed the triage vital signs and the nursing notes.  Pertinent labs & imaging results that were available during my care of the patient  were reviewed by me and considered in my medical decision making (see chart for details).        73 year old gentleman history of prostate cancer on oral chemotherapy presents to the emergency department with right low back pain rating down right leg.  On exam, no neurologic deficits.  Suspect pain related to metastasis. Obtained CT and x-rays to evaluate extent of metastases as well as rule out pathologic fracture, mass, rule out acute lumbar pathology, cord or nerve root impingement.  While awaiting imaging, reassessment, patient was signed out to [employer].  Please see her note for final plan and disposition.  Final Clinical Impressions(s) / ED Diagnoses   Final diagnoses:  Prostate cancer ([place])  Right leg pain  Right low back pain, unspecified chronicity, unspecified whether sciatica present    ED Discharge Orders    None       Lucrezia Starch, MD 05/04/19 2232

## 2019-05-04 NOTE — ED Provider Notes (Signed)
73 year old male received a signout from Dr. Roslynn Amble pending CT A/P. Per his HPI:   "Danny Mccall is a 73 y.o. male.  Past medical history of prostate cancer, currently undergoing oral chemotherapy agent.  Patient reports that he has had severe right lower back, right hip and right leg pain.  Pain is worse in his hip and upper leg.  Described as a sharp shooting pain, up to 10 out of 10 in severity.  Has been taking his at home oxycodone and oral morphine without significant relief.  States he was supposed to have CT scan scheduled as outpatient for assessment of metastases but has not had this completed yet."  Physical Exam  BP (!) 128/58 (BP Location: Right Arm)   Pulse 60   Temp 98.5 F (36.9 C) (Oral)   Resp 16   SpO2 100%   Physical Exam Vitals signs and nursing note reviewed.  Constitutional:      General: He is not in acute distress.    Appearance: He is well-developed.     Comments: NAD  HENT:     Head: Normocephalic.  Eyes:     Conjunctiva/sclera: Conjunctivae normal.  Cardiovascular:     Rate and Rhythm: Normal rate.  Pulmonary:     Effort: Pulmonary effort is normal.     Breath sounds: No stridor.  Musculoskeletal: Normal range of motion.  Neurological:     Mental Status: He is alert and oriented to person, place, and time.     ED Course/Procedures     Procedures  MDM   73 year old male with a history of prostate cancer undergoing oral chemotherapy received a signout from Dr. Roslynn Amble pending CT abdomen pelvis.  He reports the pain is currently well controlled and he is feeling much better.  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  He also had extensive sigmoid diverticulosis and moderate stool noted throughout the colon.  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  He reports that he did have a bowel movement several days ago after taking a suppository at home.  Since there is no compression of the spinal cord, he can follow-up with oncology regarding back pain that is likely secondary to bone mets, but this may be currently exacerbated by constipation.  We will place the patient on docusate sodium, bisacodyl, and MiraLAX regimen.  He is requesting a medication tonight in the ER, and bisacodyl has been ordered.  He has no complaints at this time.  ER return precautions given.  He is hemodynamically stable and in no acute distress.  Safe for discharge to home with outpatient follow-up.       Joanne Gavel, PA-C 05/05/19 0043    Lucrezia Starch, MD 05/05/19 1321

## 2019-05-04 NOTE — ED Triage Notes (Addendum)
Non traumatic rt leg pain started Tuesday. Starts in hip and goes down leg. With muscle tightness in thigh. Pt has history of Prostate Cancer

## 2019-05-05 MED ORDER — POLYETHYLENE GLYCOL 3350 17 G PO PACK: 17.0000 g | PACK | Freq: Every day | ORAL | 0 refills | Status: AC | PRN

## 2019-05-05 MED ORDER — DOCUSATE SODIUM 100 MG PO CAPS: 100.0000 mg | ORAL_CAPSULE | Freq: Two times a day (BID) | ORAL | 0 refills | Status: DC

## 2019-05-05 MED ORDER — BISACODYL 5 MG PO TBEC
5.0000 mg | DELAYED_RELEASE_TABLET | Freq: Once | ORAL | Status: AC
  Administered 2019-05-05: 5 mg via ORAL
  Filled 2019-05-05: qty 1

## 2019-05-05 MED ORDER — BISACODYL 10 MG RE SUPP: 10.0000 mg | RECTAL | 0 refills | Status: DC | PRN

## 2019-05-05 NOTE — Discharge Instructions (Addendum)
Thank you for allowing me to care for you today in the Emergency Department.    Please follow-up with your oncologist regarding pain control and pain medication as well as dosing.  Your CT did not show you were constipated today.  You can take 1 packet of MiraLAX, polyethylene glycol, every day as needed.  Mix the 17 g in the packet and 120 to 240 mL's of liquid.  You can also take 1 capsule of docusate sodium by mouth every 12 hours as needed for constipation or 1 bisacodyl suppository rectally as needed for constipation.  Continue this regimen daily until your bowel movements become soft.  You can discontinue the docusate sodium and bisacodyl if your bowel movements become watery and very loose.  Return to the emergency department if you develop severe abdominal pain and you become constipated and stop passing gas, if you develop new numbness or weakness in your legs, if you become unable to control your bowel or bladder, or develop other new, concerning symptoms.

## 2019-07-04 ENCOUNTER — Other Ambulatory Visit: Payer: Self-pay

## 2019-07-04 ENCOUNTER — Emergency Department (HOSPITAL_COMMUNITY)
Admission: EM | Admit: 2019-07-04 | Discharge: 2019-07-05 | Disposition: A | Discharge status: Home / Self Care | Payer: Medicare HMO | Attending: Emergency Medicine | Admitting: Emergency Medicine

## 2019-07-04 ENCOUNTER — Emergency Department (HOSPITAL_COMMUNITY): Payer: Medicare HMO

## 2019-07-04 DIAGNOSIS — Z7984 Long term (current) use of oral hypoglycemic drugs: Secondary | ICD-10-CM | POA: Diagnosis not present

## 2019-07-04 DIAGNOSIS — E119 Type 2 diabetes mellitus without complications: Secondary | ICD-10-CM | POA: Diagnosis not present

## 2019-07-04 DIAGNOSIS — Z79899 Other long term (current) drug therapy: Secondary | ICD-10-CM | POA: Diagnosis not present

## 2019-07-04 DIAGNOSIS — C61 Malignant neoplasm of prostate: Secondary | ICD-10-CM | POA: Insufficient documentation

## 2019-07-04 DIAGNOSIS — R0789 Other chest pain: Secondary | ICD-10-CM | POA: Insufficient documentation

## 2019-07-04 DIAGNOSIS — I10 Essential (primary) hypertension: Secondary | ICD-10-CM | POA: Insufficient documentation

## 2019-07-04 DIAGNOSIS — Z87891 Personal history of nicotine dependence: Secondary | ICD-10-CM | POA: Diagnosis not present

## 2019-07-04 LAB — BASIC METABOLIC PANEL
Anion gap: 12 (ref 5–15)
BUN: 6 mg/dL — ABNORMAL LOW (ref 8–23)
CO2: 18 mmol/L — ABNORMAL LOW (ref 22–32)
Calcium: 8.6 mg/dL — ABNORMAL LOW (ref 8.9–10.3)
Chloride: 110 mmol/L (ref 98–111)
Creatinine, Ser: 0.91 mg/dL (ref 0.61–1.24)
GFR calc Af Amer: >60 mL/min (ref >60)
GFR calc non Af Amer: >60 mL/min (ref >60)
Glucose, Bld: 103 mg/dL — ABNORMAL HIGH (ref 70–99)
Potassium: 3.9 mmol/L (ref 3.5–5.1)
Sodium: 140 mmol/L (ref 135–145)

## 2019-07-04 LAB — CBC
HCT: 33.4 % — ABNORMAL LOW (ref 39.0–52.0)
Hemoglobin: 9.8 g/dL — ABNORMAL LOW (ref 13.0–17.0)
MCH: 27.9 pg (ref 26.0–34.0)
MCHC: 29.3 g/dL — ABNORMAL LOW (ref 30.0–36.0)
MCV: 95.2 fL (ref 80.0–100.0)
Platelets: 377 10*3/uL (ref 150–400)
RBC: 3.51 MIL/uL — ABNORMAL LOW (ref 4.22–5.81)
RDW: 15.8 % — ABNORMAL HIGH (ref 11.5–15.5)
WBC: 4 10*3/uL (ref 4.0–10.5)
nRBC: 0 % (ref 0.0–0.2)

## 2019-07-04 LAB — TROPONIN I (HIGH SENSITIVITY)
Troponin I (High Sensitivity): 4 ng/L (ref <18)
Troponin I (High Sensitivity): 3 ng/L (ref <18)

## 2019-07-04 NOTE — ED Triage Notes (Signed)
Pt here for chest pain that radiates to L arm, L shoulder, and L side that began this morning, w/ SOB. Pt has prostate cancer, diabetes, glaucoma, HTN

## 2019-07-05 ENCOUNTER — Emergency Department (HOSPITAL_COMMUNITY): Payer: Medicare HMO

## 2019-07-05 DIAGNOSIS — R0789 Other chest pain: Secondary | ICD-10-CM | POA: Diagnosis not present

## 2019-07-05 MED ORDER — ONDANSETRON HCL 4 MG/2ML IJ SOLN
4.0000 mg | Freq: Once | INTRAMUSCULAR | Status: AC
  Administered 2019-07-05: 4 mg via INTRAVENOUS
  Filled 2019-07-05: qty 2

## 2019-07-05 MED ORDER — MORPHINE SULFATE (PF) 4 MG/ML IV SOLN
4.0000 mg | Freq: Once | INTRAVENOUS | Status: AC
  Administered 2019-07-05: 4 mg via INTRAVENOUS
  Filled 2019-07-05: qty 1

## 2019-07-05 MED ORDER — IOHEXOL 350 MG/ML SOLN
100.0000 mL | Freq: Once | INTRAVENOUS | Status: AC | PRN
  Administered 2019-07-05: 100 mL via INTRAVENOUS

## 2019-07-05 MED ORDER — SODIUM CHLORIDE 0.9 % IV BOLUS
500.0000 mL | Freq: Once | INTRAVENOUS | Status: AC
  Administered 2019-07-05: 500 mL via INTRAVENOUS

## 2019-07-05 NOTE — ED Provider Notes (Signed)
[facility] EMERGENCY DEPARTMENT Provider Note   CSN: [id number] Arrival date & time: 07/04/19  1749     History   Chief Complaint Chief Complaint  Patient presents with  . Chest Pain    HPI KENZIE PASTRAN is a 73 y.o. male.  HPI: A 73 year old patient with a history of treated diabetes, hypertension and hypercholesterolemia presents for evaluation of chest pain. Initial onset of pain was less than one hour ago. The patient's chest pain is sharp and is not worse with exertion. The patient's chest pain is middle- or left-sided, is not well-localized, is not described as heaviness/pressure/tightness and does not radiate to the arms/jaw/neck. The patient does not complain of nausea and denies diaphoresis. The patient has no history of stroke, has no history of peripheral artery disease, has not smoked in the past 90 days, has no relevant family history of coronary artery disease (first degree relative at less than age 83) and does not have an elevated BMI (>=30).   HPI  Past Medical History:  Diagnosis Date  . Anemia   . Diabetes mellitus without complication ([place])   . Duodenal ulcer 10/2017  . Hypertension   . Prostate cancer [facility])    Gleason 9    Patient Active Problem List   Diagnosis Date Noted  . Melena 11/07/2017  . H. pylori duodenitis 11/07/2017  . Prostate cancer ([place]) 11/07/2017  . BPH (benign prostatic hyperplasia) 11/07/2017  . HLD (hyperlipidemia) 11/07/2017  . Heme positive stool   . Duodenum ulcer   . Anemia 11/04/2017  . HTN (hypertension) 07/30/2013  . Diabetes ([place]) 07/30/2013    Past Surgical History:  Procedure Laterality Date  . ABDOMINAL SURGERY    . CHOLECYSTECTOMY    . ESOPHAGOGASTRODUODENOSCOPY N/A 11/05/2017   Procedure: ESOPHAGOGASTRODUODENOSCOPY (EGD);  Surgeon: Jerene Bears, MD;  [facility] END[facility];  Service: Gastroenterology;  Laterality: N/A;  . EYE SURGERY Right         Home Medications    Prior to  Admission medications   Medication Sig Start Date End Date Taking? Authorizing Provider  abiraterone acetate (ZYTIGA) 250 MG tablet Take 1,000 mg by mouth daily. Take on an empty stomach 1 hour before or 2 hours after a meal    [provider]  Adalimumab (HUMIRA) 40 MG/0.4ML PSKT Inject 40 mg into the skin every 14 (fourteen) days.    [provider]  atorvastatin (LIPITOR) 20 MG tablet Take 20 mg by mouth daily.    [provider]  bisacodyl (DULCOLAX) 10 MG suppository Place 1 suppository (10 mg total) rectally as needed for moderate constipation. 05/05/19   McDonald, Mia A, PA-C  docusate sodium (COLACE) 100 MG capsule Take 1 capsule (100 mg total) by mouth every 12 (twelve) hours. 05/05/19   McDonald, Mia A, PA-C  latanoprost (XALATAN) 0.005 % ophthalmic solution Place 1 drop into the left eye at bedtime.    [provider]  lisinopril (PRINIVIL,ZESTRIL) 10 MG tablet Take 10 mg by mouth daily.    [provider]  methazolamide (NEPTAZANE) 50 MG tablet Take 50 mg by mouth 3 (three) times daily.    [provider]  oxyCODONE (OXY IR/ROXICODONE) 5 MG immediate release tablet Take 5 mg by mouth every 4 (four) hours as needed for severe pain.    [provider]  pantoprazole (PROTONIX) 40 MG tablet Take 1 tablet (40 mg total) by mouth 2 (two) times daily before a meal. 11/07/17 05/04/19  Dessa Phi, DO  polyethylene glycol (MIRALAX) 17 g packet Take 17 g by mouth daily as needed (constipation). 05/05/19   McDonald, Mia A, PA-C  prednisoLONE acetate (PRED FORTE) 1 % ophthalmic suspension Place 1 drop into the right eye 2 (two) times daily. 10/25/17   [provider]  predniSONE (DELTASONE) 5 MG tablet Take 5 mg by mouth daily with breakfast.    [provider]  prochlorperazine (COMPAZINE) 5 MG tablet Take 5 mg by mouth every 6 (six) hours as needed for nausea or vomiting.    [provider]  SIMBRINZA 1-0.2 % SUSP  Place 1 drop into the right eye 2 (two) times daily. 08/24/17   [provider]  sitaGLIPtin-metformin (JANUMET) 50-1000 MG tablet Take 1 tablet by mouth daily.    [provider]  tamsulosin (FLOMAX) 0.4 MG CAPS capsule Take 0.4 mg by mouth daily.    [provider]  timolol (BETIMOL) 0.5 % ophthalmic solution Place 1 drop into the right eye 2 (two) times daily.    [provider]  valACYclovir (VALTREX) 1000 MG tablet Take 1,000 mg by mouth 3 (three) times daily.    [provider]    Family History Family History  Problem Relation Age of Onset  . Diabetes Mother   . Cancer Father        throat  . Colon cancer Other        sister's child    Social History Social History   Tobacco Use  . Smoking status: Former Smoker    Packs/day: 0.50    Types: Cigarettes    Quit date: 11/09/2016    Years since quitting: 2.6  . Smokeless tobacco: Never Used  Substance Use Topics  . Alcohol use: Yes    Comment: rarely  . Drug use: No     Allergies   Penicillins   Review of Systems Review of Systems  Cardiovascular: Positive for chest pain.  All other systems reviewed and are negative.    Physical Exam Updated Vital Signs BP 119/74 (BP Location: Right Arm)   Pulse 71   Temp 98.2 F (36.8 C) (Oral)   Resp 18   SpO2 100%   Physical Exam Vitals signs and nursing note reviewed.  Constitutional:      General: He is not in acute distress.    Appearance: Normal appearance. He is well-developed.  HENT:     Head: Normocephalic and atraumatic.     Right Ear: Hearing normal.     Left Ear: Hearing normal.     Nose: Nose normal.  Eyes:     Conjunctiva/sclera: Conjunctivae normal.     Pupils: Pupils are equal, round, and reactive to light.  Neck:     Musculoskeletal: Normal range of motion and neck supple.  Cardiovascular:     Rate and Rhythm: Regular rhythm.     Heart sounds: S1 normal and S2 normal. No murmur. No friction rub. No  gallop.   Pulmonary:     Effort: Pulmonary effort is normal. No respiratory distress.     Breath sounds: Normal breath sounds.  Chest:     Chest wall: No tenderness.  Abdominal:     General: Bowel sounds are normal.     Palpations: Abdomen is soft.     Tenderness: There is no abdominal tenderness. There is no guarding or rebound. Negative signs include Murphy's sign and McBurney's sign.     Hernia: No hernia is present.  Musculoskeletal: Normal range of motion.  Skin:  General: Skin is warm and dry.     Findings: No rash.  Neurological:     Mental Status: He is alert and oriented to person, place, and time.     GCS: GCS eye subscore is 4. GCS verbal subscore is 5. GCS motor subscore is 6.     Cranial Nerves: No cranial nerve deficit.     Sensory: No sensory deficit.     Coordination: Coordination normal.  Psychiatric:        Speech: Speech normal.        Behavior: Behavior normal.        Thought Content: Thought content normal.      ED Treatments / Results  Labs (all labs ordered are listed, but only abnormal results are displayed) Labs Reviewed  BASIC METABOLIC PANEL - Abnormal; Notable for the following components:      Result Value   CO2 18 (*)    Glucose, Bld 103 (*)    BUN 6 (*)    Calcium 8.6 (*)    All other components within normal limits  CBC - Abnormal; Notable for the following components:   RBC 3.51 (*)    Hemoglobin 9.8 (*)    HCT 33.4 (*)    MCHC 29.3 (*)    RDW 15.8 (*)    All other components within normal limits  TROPONIN I (HIGH SENSITIVITY)  TROPONIN I (HIGH SENSITIVITY)    EKG EKG Interpretation  Date/Time:  Friday July 04 2019 17:58:06 EDT Ventricular Rate:  66 PR Interval:  152 QRS Duration: 70 QT Interval:  422 QTC Calculation: 442 R Axis:   76 Text Interpretation:  Normal sinus rhythm Septal infarct , age undetermined Abnormal ECG No significant change since last tracing Confirmed by Orpah Greek [phone]) on 07/05/2019  2:18:21 AM   Radiology Dg Chest 2 View  Result Date: 07/04/2019 CLINICAL DATA:  Chest pain.  History of prostate carcinoma EXAM: CHEST - 2 VIEW COMPARISON:  May 09, 2017. FINDINGS: There is no appreciable edema or consolidation. The heart size and pulmonary vascularity are normal. No adenopathy. Bones show diffuse sclerosis, likely due to widespread blastic metastatic disease from prostate carcinoma. IMPRESSION: Widespread blastic bony metastatic disease. No edema or consolidation. No evident adenopathy. Electronically Signed   By: Lowella Grip III M.D.   On: 07/04/2019 19:11   Ct Angio Chest Pe W Or Wo Contrast  Result Date: 07/05/2019 CLINICAL DATA:  Shortness of breath, chest pain EXAM: CT ANGIOGRAPHY CHEST WITH CONTRAST TECHNIQUE: Multidetector CT imaging of the chest was performed using the standard protocol during bolus administration of intravenous contrast. Multiplanar CT image reconstructions and MIPs were obtained to evaluate the vascular anatomy. CONTRAST:  129mL OMNIPAQUE IOHEXOL 350 MG/ML SOLN COMPARISON:  None. FINDINGS: Cardiovascular: There is a optimal opacification of the pulmonary arteries. There is no central,segmental, or subsegmental filling defects within the pulmonary arteries. The heart is normal in size. No pericardial effusion or thickening. No evidence right heart strain. There is normal three-vessel brachiocephalic anatomy without proximal stenosis. There is mild scattered atherosclerosis seen. Mediastinum/Nodes: No hilar, mediastinal, or axillary adenopathy. Thyroid gland, trachea, and esophagus demonstrate no significant findings. Lungs/Pleura: Mild centrilobular emphysematous changes seen at apices. Bibasilar dependent atelectasis. No pleural effusion or pneumothorax. No airspace consolidation. Upper Abdomen: No acute abnormalities present in the visualized portions of the upper abdomen. Musculoskeletal: Diffuse osseous blastic lesions are seen throughout the  visualized axial and appendicular skeleton. No osseous fracture is seen. Review of the MIP images  confirms the above findings. IMPRESSION: 1.  No central, segmental, or subsegmental pulmonary embolism. 2. No acute intrathoracic pathology to explain the patient's symptoms. 3.  Aortic Atherosclerosis (ICD10-I70.0). 4. Mild centrilobular emphysematous changes. 5. Diffuse osseous blastic metastatic disease. No acute osseous abnormality. Electronically Signed   By: Prudencio Pair M.D.   On: 07/05/2019 03:52    Procedures Procedures (including critical care time)  Medications Ordered in ED Medications  morphine 4 MG/ML injection 4 mg (4 mg Intravenous Given 07/05/19 0315)  ondansetron (ZOFRAN) injection 4 mg (4 mg Intravenous Given 07/05/19 0312)  sodium chloride 0.9 % bolus 500 mL (500 mLs Intravenous New Bag/Given 07/05/19 0311)  iohexol (OMNIPAQUE) 350 MG/ML injection 100 mL (100 mLs Intravenous Contrast Given 07/05/19 0334)     Initial Impression / Assessment and Plan / ED Course  I have reviewed the triage vital signs and the nursing notes.  Pertinent labs & imaging results that were available during my care of the patient were reviewed by me and considered in my medical decision making (see chart for details).     HEAR Score: 4  Patient presents to the emergency department for evaluation of chest pain.  Pain is atypical in nature.  He reports intermittent pain throughout the day.  Each episode is sharp in nature, confined to the left chest without any radiation.  Pain lasts 30 seconds to 1 minute and then resolves, recurring spontaneously without any predictable cause.  Patient with heart score of 4.  EKG is unremarkable, troponin negative x2.  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  He has known metastasis to his right femur, currently receiving radiation therapy.  CT angiography of chest was performed, no evidence of PE or other lung pathology.  He does have  diffuse osseous blastic metastatic disease, possibly the cause of his very atypical cardiac symptoms.  Chest pain algorithm is intermediate, he has risk factors but reassuring troponins.  Recommendation is either observation or close outpatient follow-up.  Based on the extremely atypical nature of his symptoms, will discharge patient.  Final Clinical Impressions(s) / ED Diagnoses   Final diagnoses:  Atypical chest pain    ED Discharge Orders    None       Orpah Greek, MD 07/05/19 [phone]

## 2019-10-30 ENCOUNTER — Ambulatory Visit: Payer: Medicare HMO

## 2019-11-03 ENCOUNTER — Ambulatory Visit: Payer: Medicare HMO | Attending: Family

## 2019-11-03 DIAGNOSIS — Z23 Encounter for immunization: Secondary | ICD-10-CM | POA: Insufficient documentation

## 2019-11-03 NOTE — Progress Notes (Signed)
   Covid-19 Vaccination Clinic  Name:  Danny Mccall    MRN: [id number] DOB: 1946-01-28  11/03/2019  Danny Mccall was observed post Covid-19 immunization for 15 minutes without incidence. He was provided with Vaccine Information Sheet and instruction to access the V-Safe system.   Danny Mccall was instructed to call 911 with any severe reactions post vaccine: Marland Kitchen Difficulty breathing  . Swelling of your face and throat  . A fast heartbeat  . A bad rash all over your body  . Dizziness and weakness    Immunizations Administered    Name Date Dose VIS Date Route   Moderna COVID-19 Vaccine 11/03/2019  1:40 PM 0.5 mL 08/12/2019 Intramuscular   Manufacturer: Moderna   Lot: [id number]   [id number]

## 2019-12-09 ENCOUNTER — Ambulatory Visit: Payer: Medicare HMO | Attending: Family

## 2019-12-09 DIAGNOSIS — Z23 Encounter for immunization: Secondary | ICD-10-CM

## 2019-12-09 NOTE — Progress Notes (Signed)
   Covid-19 Vaccination Clinic  Name:  Danny Mccall    MRN: [id number] DOB: [birth year]  12/09/2019  [name] was observed post Covid-19 immunization for 15 minutes without incident. He was provided with Vaccine Information Sheet and instruction to access the V-Safe system.   [name] was instructed to call 911 with any severe reactions post vaccine: Marland Kitchen Difficulty breathing  . Swelling of face and throat  . A fast heartbeat  . A bad rash all over body  . Dizziness and weakness   Immunizations Administered    Name Date Dose VIS Date Route   Moderna COVID-19 Vaccine 12/09/2019 11:58 AM 0.5 mL 08/12/2019 Intramuscular   Manufacturer: Moderna   LotFP:[id number]   [place] CenterBE:[id number]

## 2020-02-10 ENCOUNTER — Emergency Department (HOSPITAL_COMMUNITY)
Admission: EM | Admit: 2020-02-10 | Discharge: 2020-02-10 | Disposition: A | Discharge status: Home / Self Care | Payer: Medicare HMO | Attending: Emergency Medicine | Admitting: Emergency Medicine

## 2020-02-10 ENCOUNTER — Encounter (HOSPITAL_COMMUNITY): Payer: Self-pay | Admitting: Emergency Medicine

## 2020-02-10 DIAGNOSIS — R519 Headache, unspecified: Secondary | ICD-10-CM | POA: Diagnosis present

## 2020-02-10 DIAGNOSIS — Z5321 Procedure and treatment not carried out due to patient leaving prior to being seen by health care provider: Secondary | ICD-10-CM | POA: Insufficient documentation

## 2020-02-10 LAB — BASIC METABOLIC PANEL
Anion gap: 13 (ref 5–15)
BUN: 7 mg/dL — ABNORMAL LOW (ref 8–23)
CO2: 23 mmol/L (ref 22–32)
Calcium: 8.3 mg/dL — ABNORMAL LOW (ref 8.9–10.3)
Chloride: 102 mmol/L (ref 98–111)
Creatinine, Ser: 0.61 mg/dL (ref 0.61–1.24)
GFR calc Af Amer: >60 mL/min (ref >60)
GFR calc non Af Amer: >60 mL/min (ref >60)
Glucose, Bld: 119 mg/dL — ABNORMAL HIGH (ref 70–99)
Potassium: 3.3 mmol/L — ABNORMAL LOW (ref 3.5–5.1)
Sodium: 138 mmol/L (ref 135–145)

## 2020-02-10 LAB — CBC
HCT: 26.2 % — ABNORMAL LOW (ref 39.0–52.0)
Hemoglobin: 8.1 g/dL — ABNORMAL LOW (ref 13.0–17.0)
MCH: 28.6 pg (ref 26.0–34.0)
MCHC: 30.9 g/dL (ref 30.0–36.0)
MCV: 92.6 fL (ref 80.0–100.0)
Platelets: 103 10*3/uL — ABNORMAL LOW (ref 150–400)
RBC: 2.83 MIL/uL — ABNORMAL LOW (ref 4.22–5.81)
RDW: 15.9 % — ABNORMAL HIGH (ref 11.5–15.5)
WBC: 3.2 10*3/uL — ABNORMAL LOW (ref 4.0–10.5)
nRBC: 0 % (ref 0.0–0.2)

## 2020-02-10 NOTE — ED Triage Notes (Signed)
Patient in POV. Presents with headache, pressure to R side of face and R eye pressure onset last week. Patient seen 5/28 and for same. Has history of chorioretinal inflammation.

## 2020-02-10 NOTE — ED Notes (Signed)
Pt and daugher approached this NT and stated that he does not want to stay due to the wait.

## 2020-05-07 ENCOUNTER — Other Ambulatory Visit: Payer: Medicare HMO

## 2020-05-07 ENCOUNTER — Other Ambulatory Visit: Payer: Self-pay | Admitting: Sleep Medicine

## 2020-05-07 DIAGNOSIS — I471 Supraventricular tachycardia: Secondary | ICD-10-CM

## 2020-05-09 LAB — NOVEL CORONAVIRUS, NAA: SARS-CoV-2, NAA: NOT DETECTED

## 2020-05-09 LAB — SARS-COV-2, NAA 2 DAY TAT

## 2020-07-26 ENCOUNTER — Other Ambulatory Visit: Payer: Self-pay | Admitting: Ophthalmology

## 2020-07-26 ENCOUNTER — Other Ambulatory Visit (HOSPITAL_COMMUNITY): Payer: Self-pay | Admitting: Ophthalmology

## 2020-07-26 DIAGNOSIS — H30033 Focal chorioretinal inflammation, peripheral, bilateral: Secondary | ICD-10-CM

## 2020-08-06 ENCOUNTER — Encounter (HOSPITAL_COMMUNITY): Payer: Self-pay | Admitting: Internal Medicine

## 2020-08-07 ENCOUNTER — Ambulatory Visit (HOSPITAL_COMMUNITY)
Admission: RE | Admit: 2020-08-07 | Discharge: 2020-08-07 | Disposition: A | Discharge status: Home / Self Care | Payer: Medicare HMO | Source: Ambulatory Visit | Attending: Ophthalmology | Admitting: Ophthalmology

## 2020-08-07 DIAGNOSIS — Z20822 Contact with and (suspected) exposure to covid-19: Secondary | ICD-10-CM | POA: Diagnosis present

## 2020-08-08 LAB — SARS CORONAVIRUS 2 (TAT 6-24 HRS): SARS Coronavirus 2: NEGATIVE

## 2020-08-09 ENCOUNTER — Encounter (HOSPITAL_COMMUNITY): Payer: Self-pay | Admitting: Internal Medicine

## 2020-08-09 ENCOUNTER — Other Ambulatory Visit: Payer: Self-pay

## 2020-08-09 NOTE — Progress Notes (Addendum)
[name] denies chest pain or shortness of breath. Patient tested negative on 08/07/20 for Covid and has been in quarantine since that time.  [name] has a history of HTN and Diabetes. Patient lost a tremendous amount of weight and is no longer treated for DM or HTN. Patient does not check CBG.

## 2020-08-09 NOTE — Anesthesia Preprocedure Evaluation (Addendum)
Anesthesia Evaluation  Patient identified by MRN, date of birth, ID band Patient awake    Reviewed: Allergy & Precautions, NPO status , Patient's Chart, lab work & pertinent test results  History of Anesthesia Complications Negative for: history of anesthetic complications  Airway Mallampati: II  TM Distance: >3 FB Neck ROM: Full    Dental  (+) Dental Advisory Given   Pulmonary former smoker,    Pulmonary exam normal        Cardiovascular hypertension (no longer on meds), Normal cardiovascular exam     Neuro/Psych negative neurological ROS  negative psych ROS   GI/Hepatic Neg liver ROS, PUD,   Endo/Other  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), Type 2  Renal/GU negative Renal ROS    Prostate cancer     Musculoskeletal negative musculoskeletal ROS (+)   Abdominal   Peds  Hematology negative hematology ROS (+)   Anesthesia Other Findings Covid test negative   Reproductive/Obstetrics                            Anesthesia Physical Anesthesia Plan  ASA: II  Anesthesia Plan: General   Post-op Pain Management:    Induction: Intravenous  PONV Risk Score and Plan: 2 and Treatment may vary due to age or medical condition, Ondansetron and Dexamethasone  Airway Management Planned: LMA  Additional Equipment: None  Intra-op Plan:   Post-operative Plan: Extubation in OR  Informed Consent: I have reviewed the patients History and Physical, chart, labs and discussed the procedure including the risks, benefits and alternatives for the proposed anesthesia with the patient or authorized representative who has indicated his/her understanding and acceptance.     Dental advisory given and Consent reviewed with POA  Plan Discussed with: CRNA and Anesthesiologist  Anesthesia Plan Comments:        Anesthesia Quick Evaluation

## 2020-08-10 ENCOUNTER — Ambulatory Visit (HOSPITAL_COMMUNITY): Payer: Medicare HMO | Admitting: Anesthesiology

## 2020-08-10 ENCOUNTER — Ambulatory Visit (HOSPITAL_COMMUNITY)
Admission: RE | Admit: 2020-08-10 | Discharge: 2020-08-10 | Disposition: A | Discharge status: Home / Self Care | Payer: Medicare HMO | Source: Ambulatory Visit | Attending: Ophthalmology | Admitting: Ophthalmology

## 2020-08-10 ENCOUNTER — Encounter (HOSPITAL_COMMUNITY): Payer: Self-pay

## 2020-08-10 ENCOUNTER — Encounter (HOSPITAL_COMMUNITY): Payer: Self-pay | Admitting: Internal Medicine

## 2020-08-10 ENCOUNTER — Encounter (HOSPITAL_COMMUNITY)
Admission: RE | Disposition: A | Discharge status: Home / Self Care | Payer: Self-pay | Source: Home / Self Care | Attending: Internal Medicine

## 2020-08-10 ENCOUNTER — Ambulatory Visit (HOSPITAL_COMMUNITY)
Admission: RE | Admit: 2020-08-10 | Discharge: 2020-08-10 | Disposition: A | Discharge status: Home / Self Care | Payer: Medicare HMO | Attending: Internal Medicine | Admitting: Internal Medicine

## 2020-08-10 ENCOUNTER — Ambulatory Visit (HOSPITAL_COMMUNITY): Admission: RE | Admit: 2020-08-10 | Payer: Medicare HMO | Source: Ambulatory Visit

## 2020-08-10 DIAGNOSIS — H3093 Unspecified chorioretinal inflammation, bilateral: Secondary | ICD-10-CM | POA: Diagnosis present

## 2020-08-10 DIAGNOSIS — C61 Malignant neoplasm of prostate: Secondary | ICD-10-CM | POA: Insufficient documentation

## 2020-08-10 DIAGNOSIS — H30033 Focal chorioretinal inflammation, peripheral, bilateral: Secondary | ICD-10-CM

## 2020-08-10 DIAGNOSIS — C7951 Secondary malignant neoplasm of bone: Secondary | ICD-10-CM | POA: Insufficient documentation

## 2020-08-10 DIAGNOSIS — R9389 Abnormal findings on diagnostic imaging of other specified body structures: Secondary | ICD-10-CM | POA: Insufficient documentation

## 2020-08-10 HISTORY — DX: Focal chorioretinal inflammation, peripheral, unspecified eye: H30.039

## 2020-08-10 HISTORY — DX: Unspecified open-angle glaucoma, stage unspecified: H40.10X0

## 2020-08-10 HISTORY — PX: RADIOLOGY WITH ANESTHESIA: SHX6223

## 2020-08-10 HISTORY — DX: Panuveitis, bilateral: H44.113

## 2020-08-10 HISTORY — DX: Personal history of other medical treatment: Z92.89

## 2020-08-10 LAB — BASIC METABOLIC PANEL
Anion gap: 9 (ref 5–15)
BUN: 10 mg/dL (ref 8–23)
CO2: 23 mmol/L (ref 22–32)
Calcium: 7.7 mg/dL — ABNORMAL LOW (ref 8.9–10.3)
Chloride: 105 mmol/L (ref 98–111)
Creatinine, Ser: 0.66 mg/dL (ref 0.61–1.24)
GFR, Estimated: >60 mL/min (ref >60)
Glucose, Bld: 91 mg/dL (ref 70–99)
Potassium: 3.5 mmol/L (ref 3.5–5.1)
Sodium: 137 mmol/L (ref 135–145)

## 2020-08-10 LAB — GLUCOSE, CAPILLARY: Glucose-Capillary: 86 mg/dL (ref 70–99)

## 2020-08-10 LAB — CBC
HCT: 18.3 % — ABNORMAL LOW (ref 39.0–52.0)
Hemoglobin: 5.6 g/dL — CL (ref 13.0–17.0)
MCH: 30.1 pg (ref 26.0–34.0)
MCHC: 30.6 g/dL (ref 30.0–36.0)
MCV: 98.4 fL (ref 80.0–100.0)
Platelets: 44 10*3/uL — ABNORMAL LOW (ref 150–400)
RBC: 1.86 MIL/uL — ABNORMAL LOW (ref 4.22–5.81)
RDW: 17.2 % — ABNORMAL HIGH (ref 11.5–15.5)
WBC: 3.1 10*3/uL — ABNORMAL LOW (ref 4.0–10.5)
nRBC: 7.1 % — ABNORMAL HIGH (ref 0.0–0.2)

## 2020-08-10 LAB — PREPARE RBC (CROSSMATCH)

## 2020-08-10 SURGERY — MRI WITH ANESTHESIA
Anesthesia: General

## 2020-08-10 MED ORDER — PROPOFOL 10 MG/ML IV BOLUS
INTRAVENOUS | Status: DC | PRN
  Administered 2020-08-10: 100 mg via INTRAVENOUS

## 2020-08-10 MED ORDER — LACTATED RINGERS IV SOLN: INTRAVENOUS | Status: DC

## 2020-08-10 MED ORDER — DEXAMETHASONE SODIUM PHOSPHATE 10 MG/ML IJ SOLN
INTRAMUSCULAR | Status: DC | PRN
  Administered 2020-08-10: 10 mg via INTRAVENOUS

## 2020-08-10 MED ORDER — ORAL CARE MOUTH RINSE: 15.0000 mL | Freq: Once | OROMUCOSAL | Status: AC

## 2020-08-10 MED ORDER — GADOBUTROL 1 MMOL/ML IV SOLN
6.0000 mL | Freq: Once | INTRAVENOUS | Status: AC | PRN
  Administered 2020-08-10: 6 mL via INTRAVENOUS

## 2020-08-10 MED ORDER — CHLORHEXIDINE GLUCONATE 0.12 % MT SOLN
15.0000 mL | Freq: Once | OROMUCOSAL | Status: AC
  Administered 2020-08-10: 15 mL via OROMUCOSAL
  Filled 2020-08-10: qty 15

## 2020-08-10 MED ORDER — PHENYLEPHRINE HCL-NACL 10-0.9 MG/250ML-% IV SOLN
INTRAVENOUS | Status: DC | PRN
  Administered 2020-08-10: 50 ug/min via INTRAVENOUS

## 2020-08-10 MED ORDER — SODIUM CHLORIDE 0.9 % IV SOLN: 10.0000 mL/h | Freq: Once | INTRAVENOUS | Status: DC

## 2020-08-10 MED ORDER — PHENYLEPHRINE 40 MCG/ML (10ML) SYRINGE FOR IV PUSH (FOR BLOOD PRESSURE SUPPORT)
PREFILLED_SYRINGE | INTRAVENOUS | Status: DC | PRN
  Administered 2020-08-10: 120 ug via INTRAVENOUS

## 2020-08-10 MED ORDER — ONDANSETRON HCL 4 MG/2ML IJ SOLN
INTRAMUSCULAR | Status: DC | PRN
  Administered 2020-08-10: 4 mg via INTRAVENOUS

## 2020-08-10 MED ORDER — LIDOCAINE 2% (20 MG/ML) 5 ML SYRINGE
INTRAMUSCULAR | Status: DC | PRN
  Administered 2020-08-10: 60 mg via INTRAVENOUS

## 2020-08-10 MED ORDER — ONDANSETRON HCL 4 MG/2ML IJ SOLN: 4.0000 mg | Freq: Once | INTRAMUSCULAR | Status: DC | PRN

## 2020-08-10 NOTE — Transfer of Care (Signed)
Immediate Anesthesia Transfer of Care Note  Patient: Dewan Emond [facility]  Procedure(s) Performed: MRI WITH ANESTHESIA OF BRAIN WITH AND WITHOUT CONTRAST AND MRI OF ORBITS WITH AND WITHOUT CONTRAST (N/A )  Patient Location: PACU  Anesthesia Type:General  Level of Consciousness: awake and alert   Airway & Oxygen Therapy: Patient Spontanous Breathing and Patient connected to nasal cannula oxygen  Post-op Assessment: Report given to RN and Post -op Vital signs reviewed and stable  Post vital signs: Reviewed and stable  Last Vitals:  Vitals Value Taken Time  BP 96/51 08/10/20 1003  Temp 36.8 C 08/10/20 1003  Pulse 30 08/10/20 1007  Resp 34 08/10/20 1007  SpO2 100 % 08/10/20 1007  Vitals shown include unvalidated device data.  Last Pain:  Vitals:   08/10/20 0700  TempSrc: Oral  PainSc: 0-No pain      Patients Stated Pain Goal: 3 (84/66/59 [id number])  Complications: No complications documented.

## 2020-08-10 NOTE — Anesthesia Procedure Notes (Signed)
Procedure Name: LMA Insertion Date/Time: 08/10/2020 8:21 AM Performed by: Lance Coon, CRNA Pre-anesthesia Checklist: Patient identified, Emergency Drugs available, Suction available, Patient being monitored and Timeout performed Patient Re-evaluated:Patient Re-evaluated prior to induction Oxygen Delivery Method: Circle system utilized Preoxygenation: Pre-oxygenation with 100% oxygen Induction Type: IV induction LMA: LMA inserted LMA Size: 4.0 Number of attempts: 1 Placement Confirmation: positive ETCO2 and breath sounds checked- equal and bilateral Tube secured with: Tape Dental Injury: Teeth and Oropharynx as per pre-operative assessment

## 2020-08-10 NOTE — Anesthesia Postprocedure Evaluation (Signed)
Anesthesia Post Note  Patient: Danny Mccall [facility]  Procedure(s) Performed: MRI WITH ANESTHESIA OF BRAIN WITH AND WITHOUT CONTRAST AND MRI OF ORBITS WITH AND WITHOUT CONTRAST (N/A )     Patient location during evaluation: PACU Anesthesia Type: General Level of consciousness: awake and alert Pain management: pain level controlled Vital Signs Assessment: post-procedure vital signs reviewed and stable Respiratory status: spontaneous breathing, nonlabored ventilation and respiratory function stable Cardiovascular status: blood pressure returned to baseline and stable Postop Assessment: no apparent nausea or vomiting Anesthetic complications: no   No complications documented.  Last Vitals:  Vitals:   08/10/20 1200 08/10/20 1215  BP: (!) 90/57 95/61  Pulse: 80 86  Resp: 19 19  Temp: 37.3 C   SpO2: 100% 100%    Last Pain:  Vitals:   08/10/20 1200  TempSrc: Temporal  PainSc: 0-No pain                 Audry Pili

## 2020-08-10 NOTE — Progress Notes (Signed)
CRITICAL VALUE ALERT  Critical Value:  HgB 5.6  Date & Time Notied:  08/10/20 0835  Provider Notified: Dr. Fransisco Beau  Orders Received/Actions taken: Patient is no longer in short stay, patient is in MRI, Dr. Fransisco Beau notified.

## 2020-08-11 ENCOUNTER — Encounter (HOSPITAL_COMMUNITY): Payer: Self-pay | Admitting: Radiology

## 2020-08-11 LAB — TYPE AND SCREEN
ABO/RH(D): A POS
Antibody Screen: NEGATIVE
Unit division: 0
Unit division: 0

## 2020-08-11 LAB — BPAM RBC
Blood Product Expiration Date: 202112302359
Blood Product Expiration Date: 202112302359
ISSUE DATE / TIME: 202111301132
ISSUE DATE / TIME: 202111301132
Unit Type and Rh: 6200
Unit Type and Rh: 6200

## 2020-08-28 ENCOUNTER — Other Ambulatory Visit: Payer: Self-pay

## 2020-08-28 ENCOUNTER — Emergency Department (HOSPITAL_COMMUNITY): Payer: Medicare HMO

## 2020-08-28 ENCOUNTER — Emergency Department (HOSPITAL_COMMUNITY)
Admission: EM | Admit: 2020-08-28 | Discharge: 2020-08-28 | Disposition: A | Discharge status: Home / Self Care | Payer: Medicare HMO | Attending: Emergency Medicine | Admitting: Emergency Medicine

## 2020-08-28 ENCOUNTER — Encounter (HOSPITAL_COMMUNITY): Payer: Self-pay | Admitting: *Deleted

## 2020-08-28 DIAGNOSIS — E119 Type 2 diabetes mellitus without complications: Secondary | ICD-10-CM | POA: Insufficient documentation

## 2020-08-28 DIAGNOSIS — Z8546 Personal history of malignant neoplasm of prostate: Secondary | ICD-10-CM | POA: Insufficient documentation

## 2020-08-28 DIAGNOSIS — I10 Essential (primary) hypertension: Secondary | ICD-10-CM | POA: Diagnosis not present

## 2020-08-28 DIAGNOSIS — K5641 Fecal impaction: Secondary | ICD-10-CM | POA: Diagnosis not present

## 2020-08-28 DIAGNOSIS — Z87891 Personal history of nicotine dependence: Secondary | ICD-10-CM | POA: Insufficient documentation

## 2020-08-28 DIAGNOSIS — R1084 Generalized abdominal pain: Secondary | ICD-10-CM | POA: Diagnosis present

## 2020-08-28 LAB — URINALYSIS, ROUTINE W REFLEX MICROSCOPIC
Bacteria, UA: NONE SEEN
Bilirubin Urine: NEGATIVE
Glucose, UA: NEGATIVE mg/dL
Hgb urine dipstick: NEGATIVE
Ketones, ur: NEGATIVE mg/dL
Leukocytes,Ua: NEGATIVE
Nitrite: NEGATIVE
Protein, ur: NEGATIVE mg/dL
Specific Gravity, Urine: 1.012 (ref 1.005–1.030)
pH: 7 (ref 5.0–8.0)

## 2020-08-28 LAB — CBC
HCT: 24.3 % — ABNORMAL LOW (ref 39.0–52.0)
Hemoglobin: 7.5 g/dL — ABNORMAL LOW (ref 13.0–17.0)
MCH: 30.2 pg (ref 26.0–34.0)
MCHC: 30.9 g/dL (ref 30.0–36.0)
MCV: 98 fL (ref 80.0–100.0)
Platelets: 29 10*3/uL — CL (ref 150–400)
RBC: 2.48 MIL/uL — ABNORMAL LOW (ref 4.22–5.81)
RDW: 17 % — ABNORMAL HIGH (ref 11.5–15.5)
WBC: 2.6 10*3/uL — ABNORMAL LOW (ref 4.0–10.5)
nRBC: 8.3 % — ABNORMAL HIGH (ref 0.0–0.2)

## 2020-08-28 LAB — COMPREHENSIVE METABOLIC PANEL
ALT: 17 U/L (ref 0–44)
AST: 44 U/L — ABNORMAL HIGH (ref 15–41)
Albumin: 2.7 g/dL — ABNORMAL LOW (ref 3.5–5.0)
Alkaline Phosphatase: 322 U/L — ABNORMAL HIGH (ref 38–126)
Anion gap: 12 (ref 5–15)
BUN: 12 mg/dL (ref 8–23)
CO2: 22 mmol/L (ref 22–32)
Calcium: 8 mg/dL — ABNORMAL LOW (ref 8.9–10.3)
Chloride: 105 mmol/L (ref 98–111)
Creatinine, Ser: 0.83 mg/dL (ref 0.61–1.24)
GFR, Estimated: >60 mL/min (ref >60)
Glucose, Bld: 106 mg/dL — ABNORMAL HIGH (ref 70–99)
Potassium: 3.9 mmol/L (ref 3.5–5.1)
Sodium: 139 mmol/L (ref 135–145)
Total Bilirubin: 1.4 mg/dL — ABNORMAL HIGH (ref 0.3–1.2)
Total Protein: 6.3 g/dL — ABNORMAL LOW (ref 6.5–8.1)

## 2020-08-28 LAB — SAMPLE TO BLOOD BANK

## 2020-08-28 LAB — PROTIME-INR
INR: 1.2 (ref 0.8–1.2)
Prothrombin Time: 15 seconds (ref 11.4–15.2)

## 2020-08-28 LAB — LIPASE, BLOOD: Lipase: 19 U/L (ref 11–51)

## 2020-08-28 MED ORDER — DOCUSATE SODIUM 100 MG PO CAPS: 100.0000 mg | ORAL_CAPSULE | Freq: Two times a day (BID) | ORAL | 0 refills | Status: DC

## 2020-08-28 MED ORDER — IOHEXOL 300 MG/ML  SOLN
100.0000 mL | Freq: Once | INTRAMUSCULAR | Status: AC | PRN
  Administered 2020-08-28: 100 mL via INTRAVENOUS

## 2020-08-28 MED ORDER — MORPHINE SULFATE (PF) 4 MG/ML IV SOLN
4.0000 mg | Freq: Once | INTRAVENOUS | Status: AC
  Administered 2020-08-28: 4 mg via INTRAVENOUS
  Filled 2020-08-28: qty 1

## 2020-08-28 MED ORDER — LACTATED RINGERS IV BOLUS
1000.0000 mL | Freq: Once | INTRAVENOUS | Status: AC
  Administered 2020-08-28: 1000 mL via INTRAVENOUS

## 2020-08-28 NOTE — ED Triage Notes (Signed)
The pt has been taking percocet for pain and hes constipated

## 2020-08-28 NOTE — Discharge Instructions (Signed)
Use MiraLAX, 4 doses and 1 bottle of Gatorade, drink that over 1 hour, to help clear out, use your enemas that you have at home to help as well.  And then start taking the stool softener as you are taking the narcotics.  Follow-up with your doctors, let them know about the new findings in the liver.

## 2020-08-28 NOTE — ED Provider Notes (Signed)
[facility] EMERGENCY DEPARTMENT Provider Note   CSN: [id number] Arrival date & time: 08/28/20  0408     History Chief Complaint  Patient presents with   Abdominal Pain    Danny Mccall is a 74 y.o. male.   Abdominal Pain Pain location:  Generalized Pain quality: aching   Pain radiates to:  Does not radiate Pain severity:  Moderate Onset quality:  Gradual Duration:  3 days Timing:  Constant Progression:  Worsening Chronicity:  Recurrent (hx of constipation) Relieved by:  Nothing Worsened by:  Nothing Ineffective treatments:  None tried Associated symptoms: anorexia   Associated symptoms: no chest pain, no chills, no cough, no diarrhea, no dysuria, no fever, no nausea, no shortness of breath and no vomiting   Risk factors: multiple surgeries        Past Medical History:  Diagnosis Date   Anemia    Diabetes mellitus without complication ([place])    90/24/09"B dont have it any longer. " Last A1C was 6.2"   Duodenal ulcer 10/2017   History of blood transfusion    numerous at a [facility] facility   Hypertension    Open-angle glaucoma    bilateral   Panuveitis, both eyes    Peripheral focal chorioretinal inflammation    both eyes   Prostate cancer (HCC)    Gleason 9, Bone Metastasis    Patient Active Problem List   Diagnosis Date Noted   Melena 11/07/2017   H. pylori duodenitis 11/07/2017   Prostate cancer ([place]) 11/07/2017   BPH (benign prostatic hyperplasia) 11/07/2017   HLD (hyperlipidemia) 11/07/2017   Heme positive stool    Duodenum ulcer    Anemia 11/04/2017   HTN (hypertension) 07/30/2013   Diabetes ([place]) 07/30/2013    Past Surgical History:  Procedure Laterality Date   ABDOMINAL SURGERY     CATARACT EXTRACTION W/ INTRAOCULAR LENS  IMPLANT, BILATERAL     CHOLECYSTECTOMY     ESOPHAGOGASTRODUODENOSCOPY N/A 11/05/2017   Procedure: ESOPHAGOGASTRODUODENOSCOPY (EGD);  Surgeon: Jerene Bears, MD;   Location: [facility] ENDOSCOPY;  Service: Gastroenterology;  Laterality: N/A;   EYE SURGERY Right    , cataract, surgeries for Glaucoma   RADIOLOGY WITH ANESTHESIA N/A 08/10/2020   Procedure: MRI WITH ANESTHESIA OF BRAIN WITH AND WITHOUT CONTRAST AND MRI OF ORBITS WITH AND WITHOUT CONTRAST;  Surgeon: Radiologist, Medication, MD;  Location: [facility];  Service: Radiology;  Laterality: N/A;       Family History  Problem Relation Age of Onset   Diabetes Mother    Cancer Father        throat   Colon cancer Other        sister's child    Social History   Tobacco Use   Smoking status: Former Smoker    Packs/day: 0.50    Years: 20.00    Pack years: 10.00    Types: Cigarettes    Quit date: 11/09/2016    Years since quitting: 3.8   Smokeless tobacco: Never Used   Tobacco comment: "smoked off and on"  Vaping Use   Vaping Use: Never used  Substance Use Topics   Alcohol use: Not Currently   Drug use: No    Home Medications Prior to Admission medications   Medication Sig Start Date End Date Taking? Authorizing Provider  docusate sodium (COLACE) 100 MG capsule Take 1 capsule (100 mg total) by mouth 2 (two) times daily. 08/28/20 09/27/20  Breck Coons, MD  eye wash (,SODIUM/POTASSIUM/SOD CHLORIDE,) SOLN Place  1 drop into the left eye daily as needed for dry eyes.    [provider]  ipratropium (ATROVENT) 0.03 % nasal spray Place 1 spray into both nostrils 2 (two) times daily. 05/14/20   [provider]  ketorolac (ACULAR) 0.5 % ophthalmic solution Place 1 drop into both eyes in the morning, at noon, in the evening, and at bedtime. 07/16/20   [provider]  loratadine (CLARITIN) 10 MG tablet Take 10 mg by mouth daily. 07/22/20   [provider]  LOTEMAX SM 0.38 % GEL Place 1 drop into both eyes 4 (four) times daily. 06/08/20   [provider]  morphine (MS CONTIN) 15 MG 12 hr tablet Take 15 mg by mouth every 12 (twelve) hours as needed for pain.  07/03/20   [provider]  oxyCODONE (OXY IR/ROXICODONE) 5 MG immediate release tablet Take 5 mg by mouth 2 (two) times daily as needed for moderate pain or severe pain.     [provider]  pantoprazole (PROTONIX) 40 MG tablet Take 1 tablet (40 mg total) by mouth 2 (two) times daily before a meal. 11/07/17 05/04/19  Dessa Phi, DO  polyethylene glycol (MIRALAX) 17 g packet Take 17 g by mouth daily as needed (constipation). Patient not taking: Reported on 08/02/2020 05/05/19   McDonald, Mia A, PA-C  ROCKLATAN 0.02-0.005 % SOLN Place 1 drop into the left eye at bedtime. 06/09/20   [provider]  SIMBRINZA 1-0.2 % SUSP Place 1 drop into both eyes in the morning, at noon, and at bedtime.  08/24/17   [provider]    Allergies    Penicillins  Review of Systems   Review of Systems  Constitutional: Negative for chills and fever.  HENT: Negative for congestion and rhinorrhea.   Respiratory: Negative for cough and shortness of breath.   Cardiovascular: Negative for chest pain and palpitations.  Gastrointestinal: Positive for abdominal pain and anorexia. Negative for abdominal distention, anal bleeding, blood in stool, diarrhea, nausea and vomiting.  Genitourinary: Negative for difficulty urinating and dysuria.  Musculoskeletal: Negative for arthralgias and back pain.  Skin: Negative for color change and rash.  Neurological: Negative for light-headedness and headaches.    Physical Exam Updated Vital Signs BP (!) 110/53    Pulse 63    Temp 98.6 F (37 C)    Resp 19    Ht 5\' 11"  (1.803 m)    Wt 62.6 kg    SpO2 100%    BMI 19.25 kg/m   Physical Exam Vitals and nursing note reviewed.  Constitutional:      General: He is not in acute distress.    Appearance: Normal appearance.  HENT:     Head: Normocephalic and atraumatic.     Nose: No rhinorrhea.  Eyes:     General:        Right eye: No discharge.        Left eye: No discharge.      Conjunctiva/sclera: Conjunctivae normal.  Cardiovascular:     Rate and Rhythm: Normal rate and regular rhythm.  Pulmonary:     Effort: Pulmonary effort is normal.     Breath sounds: No stridor.  Abdominal:     General: Abdomen is flat. A surgical scar is present. There is no distension.     Palpations: Abdomen is soft.     Tenderness: There is generalized abdominal tenderness. There is no guarding. Negative signs include Murphy's sign and McBurney's sign.  Musculoskeletal:  General: No deformity or signs of injury.  Skin:    General: Skin is warm and dry.  Neurological:     General: No focal deficit present.     Mental Status: He is alert. Mental status is at baseline.     Motor: No weakness.  Psychiatric:        Mood and Affect: Mood normal.        Behavior: Behavior normal.        Thought Content: Thought content normal.     ED Results / Procedures / Treatments   Labs (all labs ordered are listed, but only abnormal results are displayed) Labs Reviewed  COMPREHENSIVE METABOLIC PANEL - Abnormal; Notable for the following components:      Result Value   Glucose, Bld 106 (*)    Calcium 8.0 (*)    Total Protein 6.3 (*)    Albumin 2.7 (*)    AST 44 (*)    Alkaline Phosphatase 322 (*)    Total Bilirubin 1.4 (*)    All other components within normal limits  CBC - Abnormal; Notable for the following components:   WBC 2.6 (*)    RBC 2.48 (*)    Hemoglobin 7.5 (*)    HCT 24.3 (*)    RDW 17.0 (*)    Platelets 29 (*)    nRBC 8.3 (*)    All other components within normal limits  LIPASE, BLOOD  URINALYSIS, ROUTINE W REFLEX MICROSCOPIC  PROTIME-INR  PATHOLOGIST SMEAR REVIEW  SAMPLE TO BLOOD BANK    EKG None  Radiology CT ABDOMEN PELVIS W CONTRAST  Result Date: 08/28/2020 CLINICAL DATA:  Abdominal pain and constipation for 3 days. Prostate carcinoma. EXAM: CT ABDOMEN AND PELVIS WITH CONTRAST TECHNIQUE: Multidetector CT imaging of the abdomen and pelvis was  performed using the standard protocol following bolus administration of intravenous contrast. CONTRAST:  11mL OMNIPAQUE IOHEXOL 300 MG/ML  SOLN COMPARISON:  05/04/2019 FINDINGS: Lower Chest: New tiny right pleural effusion. Hepatobiliary: Numerous hypovascular masses are seen involving the liver diffusely, consistent with liver metastases. Largest index mass in segment 4 measures 10.8 x 8.8 cm on image 23/3. Prior cholecystectomy. No evidence of biliary obstruction. Pancreas:  No mass or inflammatory changes. Spleen: Within normal limits in size and appearance. Adrenals/Urinary Tract: No masses identified. No evidence of ureteral calculi or hydronephrosis. Stomach/Bowel: No evidence of obstruction, inflammatory process or abnormal fluid collections. Diverticulosis is seen mainly involving the descending and sigmoid colon, however there is no evidence of diverticulitis. Large colonic stool burden is new since prior exam. Vascular/Lymphatic: No pathologically enlarged lymph nodes. No abdominal aortic aneurysm. Aortic atherosclerotic calcification noted. Reproductive:  No mass or other significant abnormality. Other: Diffuse mesenteric and body wall edema is new since prior study. Musculoskeletal: Diffuse sclerotic bone metastases show further increase since prior study. IMPRESSION: New diffuse liver metastases. Progressive diffuse sclerotic bone metastases. New tiny right pleural effusion and diffuse mesenteric and body wall edema, consistent with 3rd spacing. Large colonic stool burden, consistent with patient history of constipation. Aortic Atherosclerosis (ICD10-I70.0). Electronically Signed   By: Marlaine Hind M.D.   On: 08/28/2020 09:41    Procedures Fecal disimpaction  Date/Time: 08/28/2020 11:49 AM Performed by: Breck Coons, MD Authorized by: Breck Coons, MD  Consent: Verbal consent obtained. Risks and benefits: risks, benefits and alternatives were discussed Consent given by: patient and  spouse Patient understanding: patient states understanding of the procedure being performed Patient consent: the patient's understanding of the procedure matches  consent given Procedure consent: procedure consent matches procedure scheduled Relevant documents: relevant documents present and verified Imaging studies: imaging studies available Required items: required blood products, implants, devices, and special equipment available Patient identity confirmed: verbally with patient Time out: Immediately prior to procedure a "time out" was called to verify the correct patient, procedure, equipment, support staff and site/side marked as required. Local anesthesia used: no  Anesthesia: Local anesthesia used: no  Sedation: Patient sedated: yes Analgesia: morphine Vitals: Vital signs were monitored during sedation.  Patient tolerance: patient tolerated the procedure well with no immediate complications Comments: Large volume hard compact and soft stool mixture, no melena no bright red blood patient tolerated well    (including critical care time)  Medications Ordered in ED Medications  lactated ringers bolus 1,000 mL (0 mLs Intravenous Stopped 08/28/20 0924)  iohexol (OMNIPAQUE) 300 MG/ML solution 100 mL (100 mLs Intravenous Contrast Given 08/28/20 0844)  morphine 4 MG/ML injection 4 mg (4 mg Intravenous Given 08/28/20 0939)  lactated ringers bolus 1,000 mL (1,000 mLs Intravenous New Bag/Given 08/28/20 1145)    ED Course  I have reviewed the triage vital signs and the nursing notes.  Pertinent labs & imaging results that were available during my care of the patient were reviewed by me and considered in my medical decision making (see chart for details).    MDM Rules/Calculators/A&P                          Patient with decreased bowel movements, nausea last 3 days, history of bowel surgery, history of cancer, worsening pain.  Tried over-the-counter regimens for constipation with  no relief.  On chronic pain control, narcotics, probably related.  Far and his hemoglobin is 7.5 but he chronically needs transfusion secondary to his metastatic cancer, otherwise hemodynamically stable compared to previous labs and documentation.  Reviewed the CT imaging, there is significant metastatic disease to the liver that was unknown to the family and patient, they are counseled on this.  They have outpatient follow-up with her oncologist in 2 days.  They understand these findings.  No other acute findings, large stool burden that will require disimpaction at bedside  Patient remains comfortable, feels better after disimpaction however now has lower blood pressure, this may be related to pain medication given, we will give some additional fluids and recheck.  Again review of the patient's medical documentation shows he never has very robust blood pressures, he denies any chest pain shortness of breath or lightheadedness.  Patient continues to feel well, repeat blood pressure after fluid hydration is much improved.  He feels comfortable with discharge.  Given information about CT findings he is given information for bowel regimen and he is told to come back anytime for reassessment, the patient agrees for discharge inside his family.  Strict return precautions provided  Final Clinical Impression(s) / ED Diagnoses Final diagnoses:  Fecal impaction ([place])    Rx / DC Orders ED Discharge Orders         Ordered    docusate sodium (COLACE) 100 MG capsule  2 times daily        08/28/20 1238           Breck Coons, MD 08/28/20 1239

## 2020-08-28 NOTE — ED Notes (Signed)
Pt refusing to be changed into gown at this time

## 2020-08-28 NOTE — ED Triage Notes (Signed)
The pt is c/o abd pain for 2-3 days he has not had a bm since Monday he has cancer

## 2020-08-30 LAB — PATHOLOGIST SMEAR REVIEW

## 2020-09-02 IMAGING — CR RIGHT KNEE - COMPLETE 4+ VIEW
4 series · 4 of 4 positions shown · non-contrast
Comparison: None.

CLINICAL DATA: 73-year-old male with history of prostate cancer
presenting with nontraumatic right lower extremity pain.

EXAM:
DG HIP (WITH OR WITHOUT PELVIS) 1V RIGHT; RIGHT FEMUR 2 VIEWS; RIGHT
KNEE - COMPLETE 4+ VIEW

[t knee ap right]
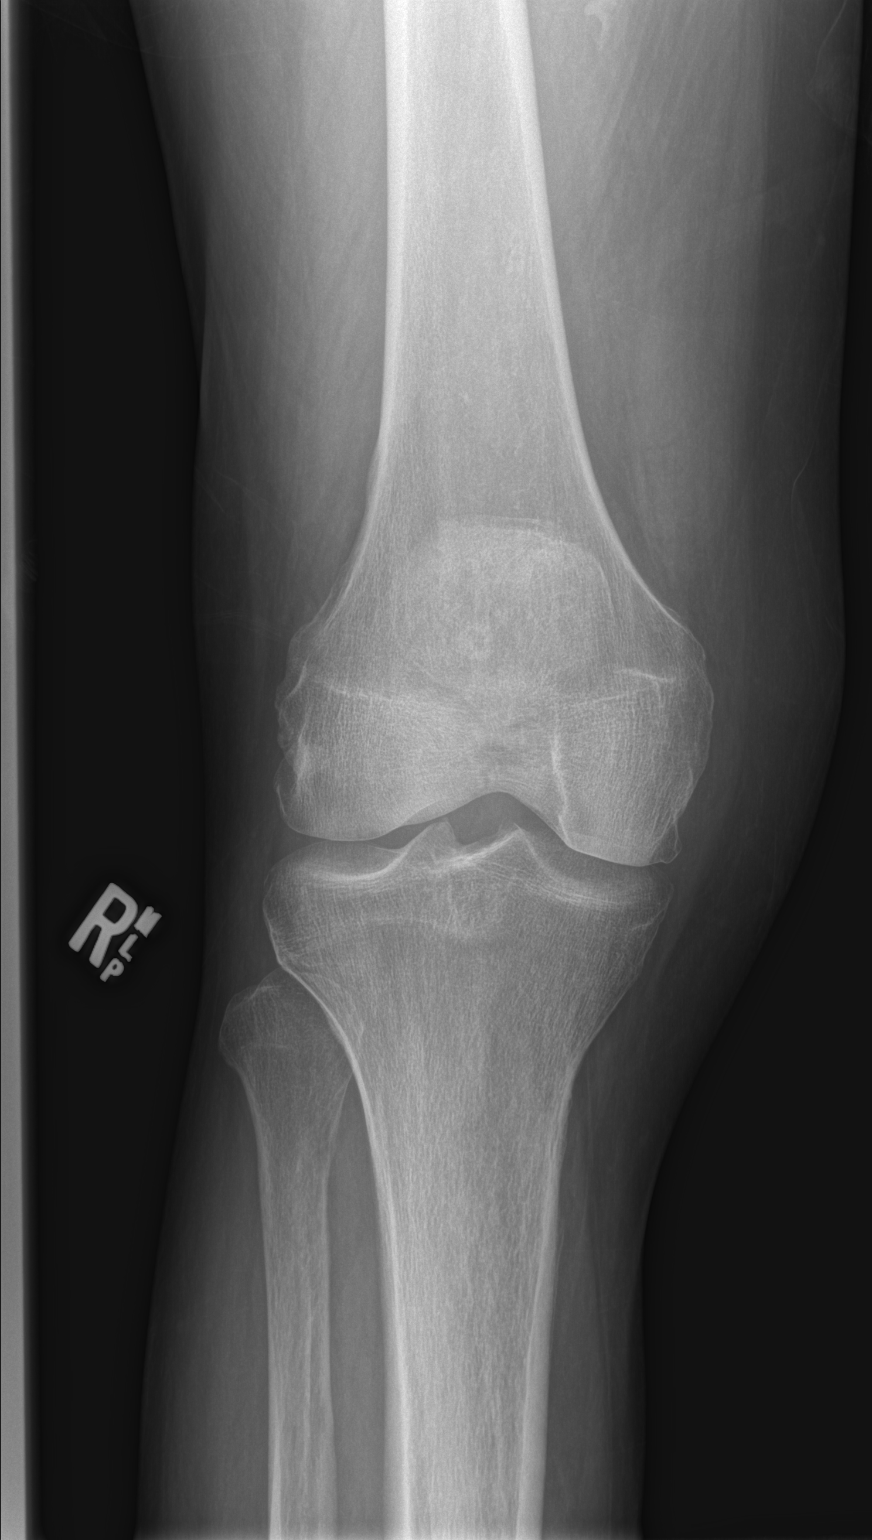

[t knee obl right (1 of 2)]
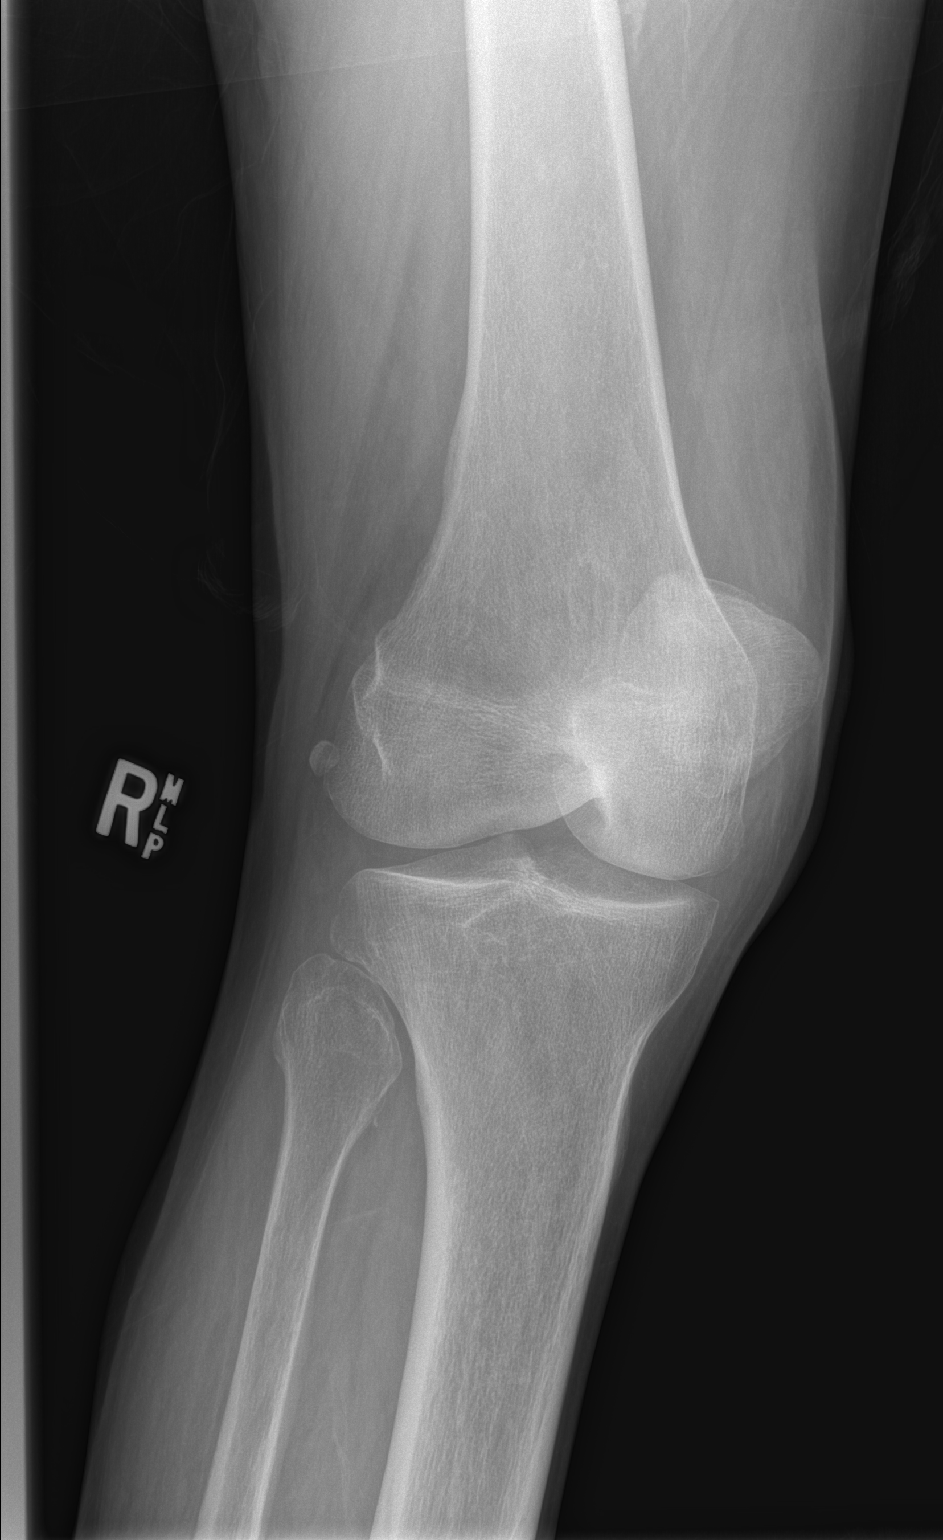

[t knee obl right (2 of 2)]
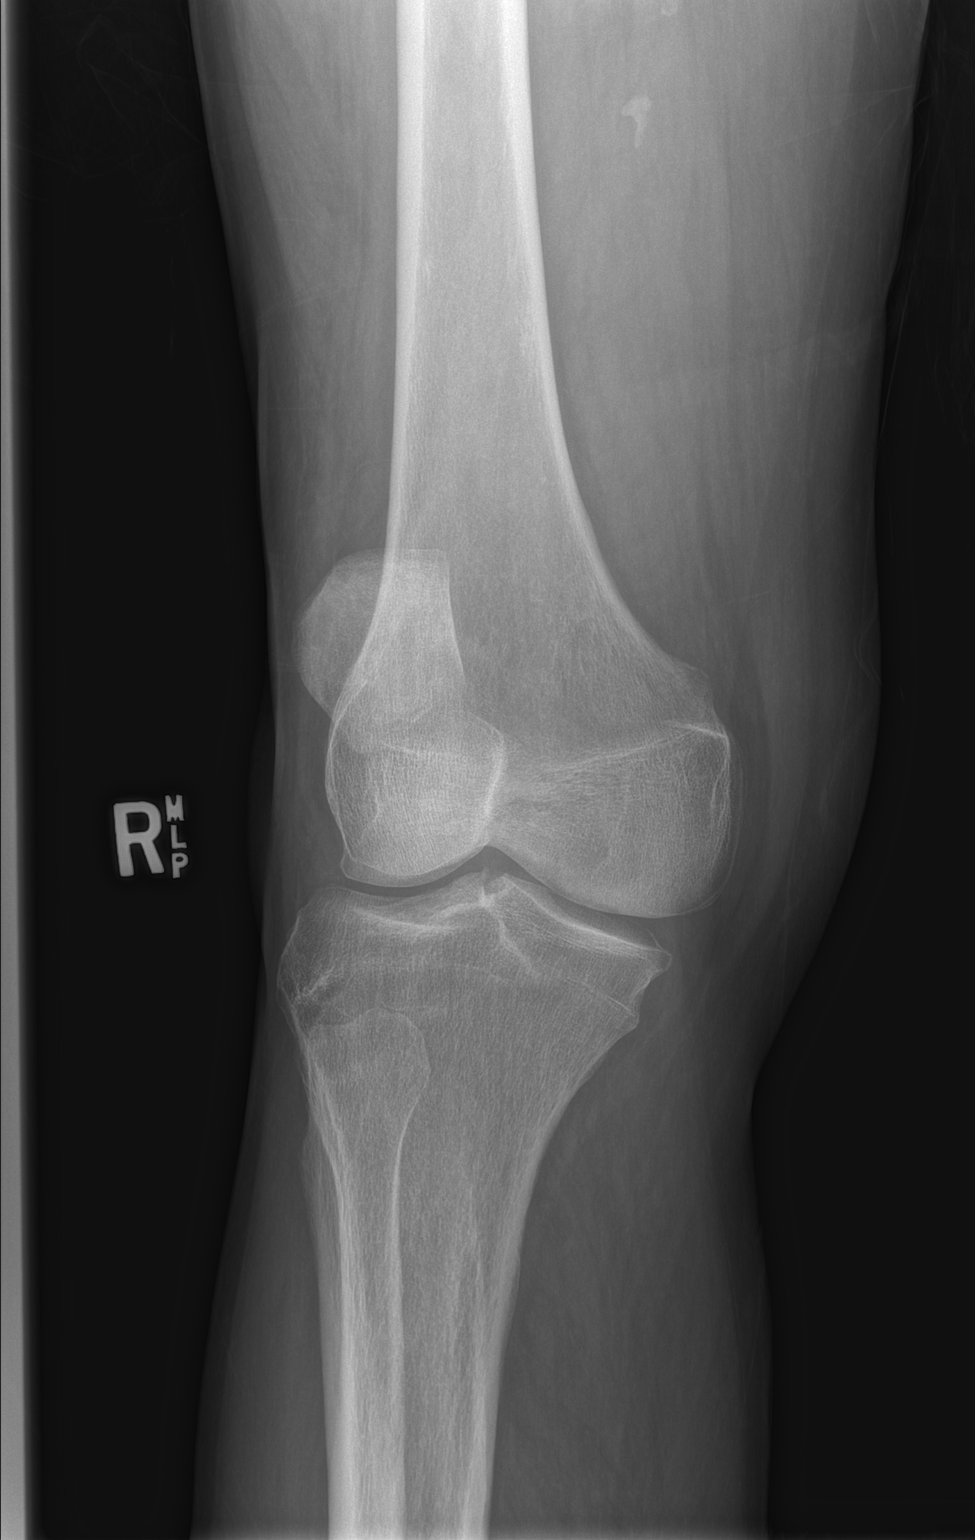

[x knee lat right]
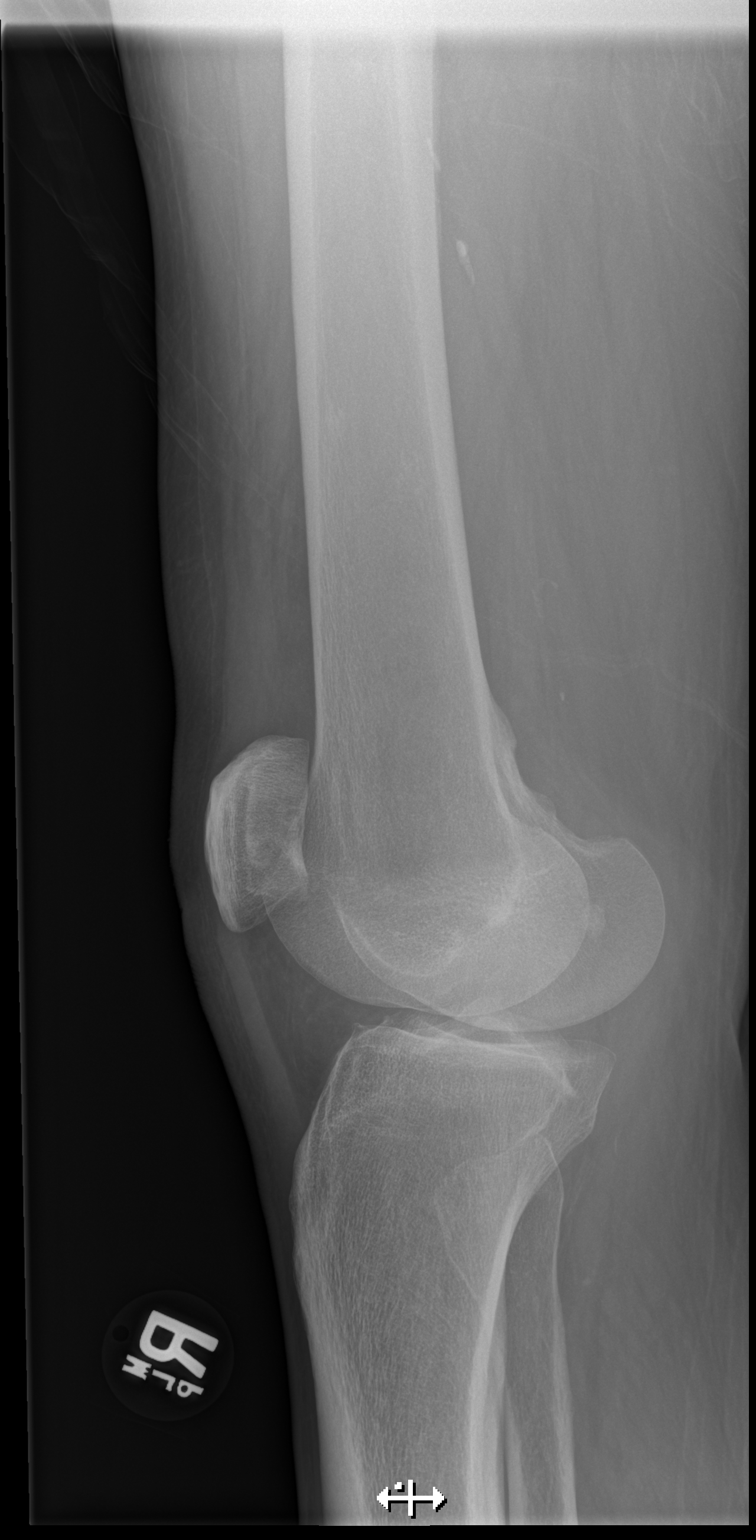

[4 of 4 positions shown; findings below may reference images not displayed]

FINDINGS: There is no acute fracture or dislocation. There is mild osteopenia.
Mild arthritic changes of the hips. Diffuse sclerotic changes of the
visualized lower lumbar spine as well as scattered sclerotic lesion
of the pelvic bones consistent with metastatic disease. Prostate
fiducial markers noted. The soft tissues are unremarkable.
IMPRESSION: 1. No acute fracture or dislocation.
2. Osseous metastatic disease primarily involving the visualized
lower lumbar spine and pelvic bone.

## 2020-09-02 IMAGING — CR RIGHT FEMUR 2 VIEWS
4 series · 4 of 4 positions shown · non-contrast
Comparison: None.

CLINICAL DATA: 73-year-old male with history of prostate cancer
presenting with nontraumatic right lower extremity pain.

EXAM:
DG HIP (WITH OR WITHOUT PELVIS) 1V RIGHT; RIGHT FEMUR 2 VIEWS; RIGHT
KNEE - COMPLETE 4+ VIEW

[t femur proximal ap right]
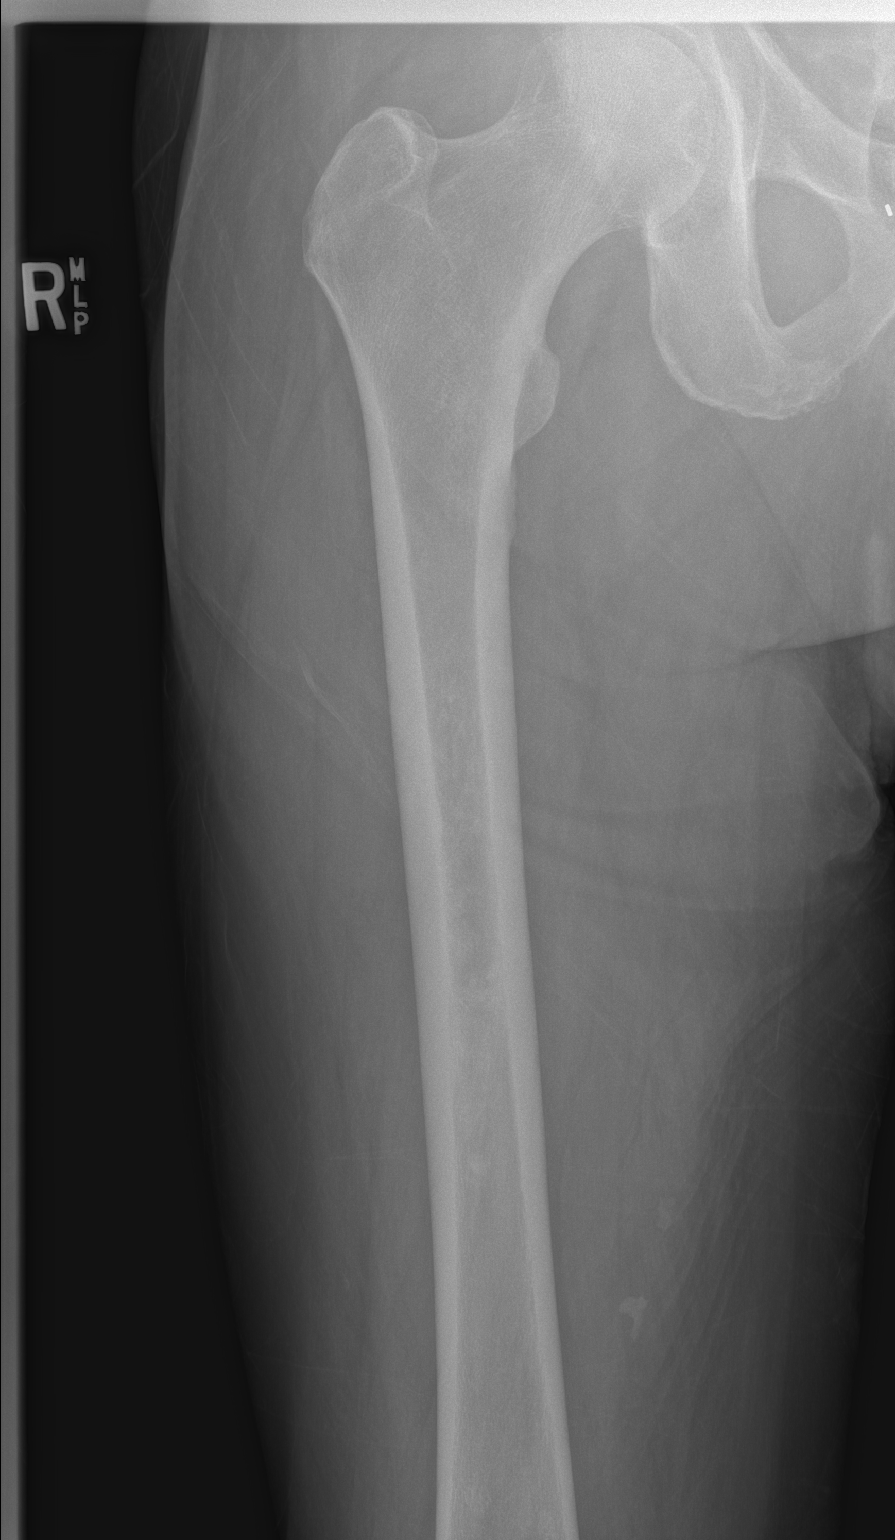

[t femur distal ap right]
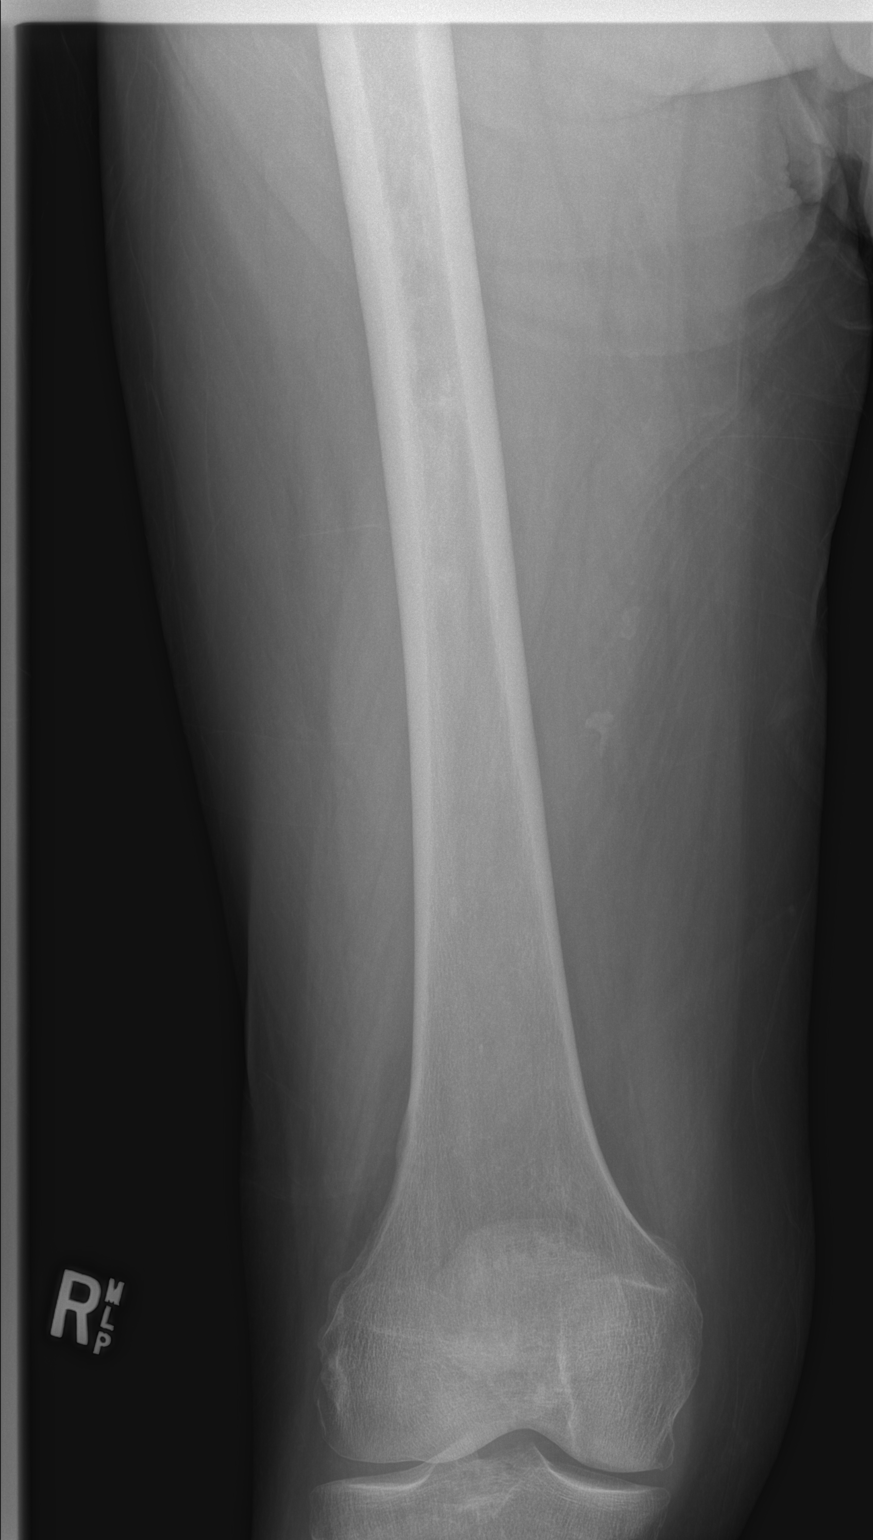

[x femur distal lat right]
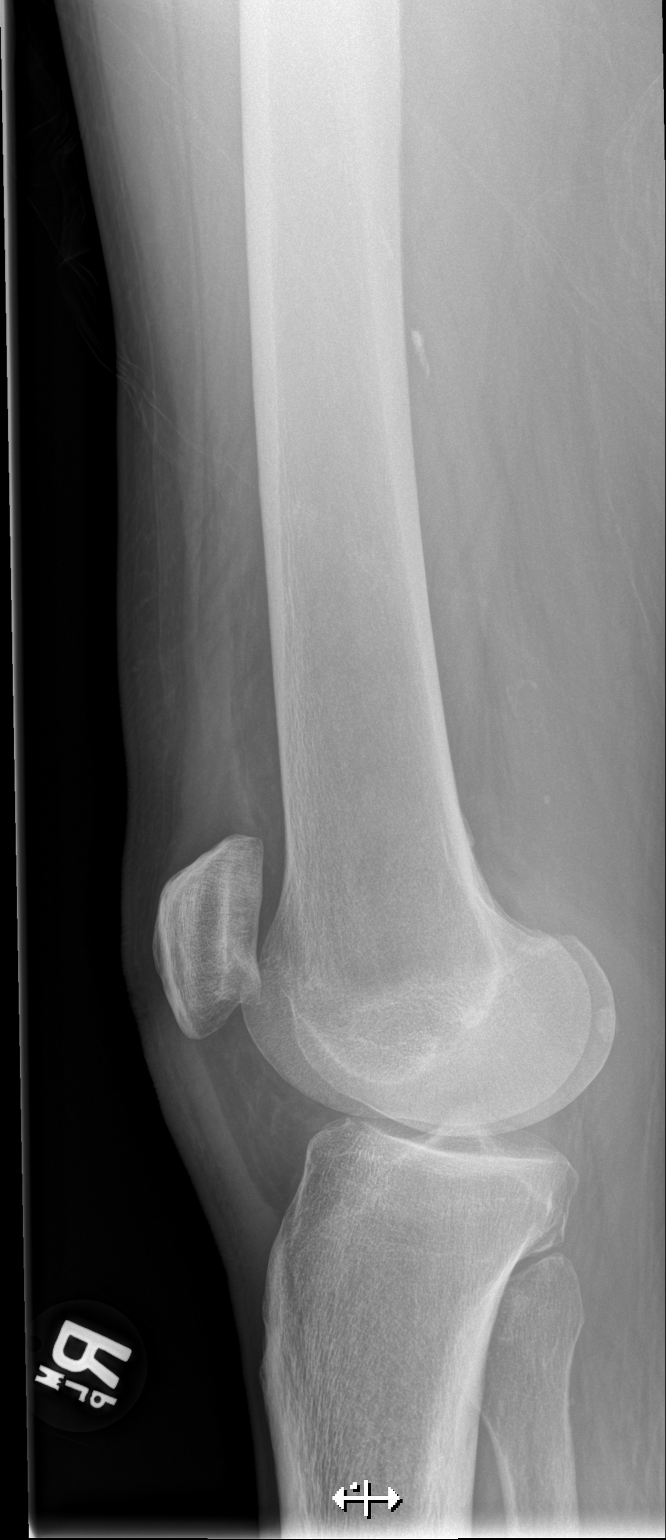

[t femur proximal lat right]
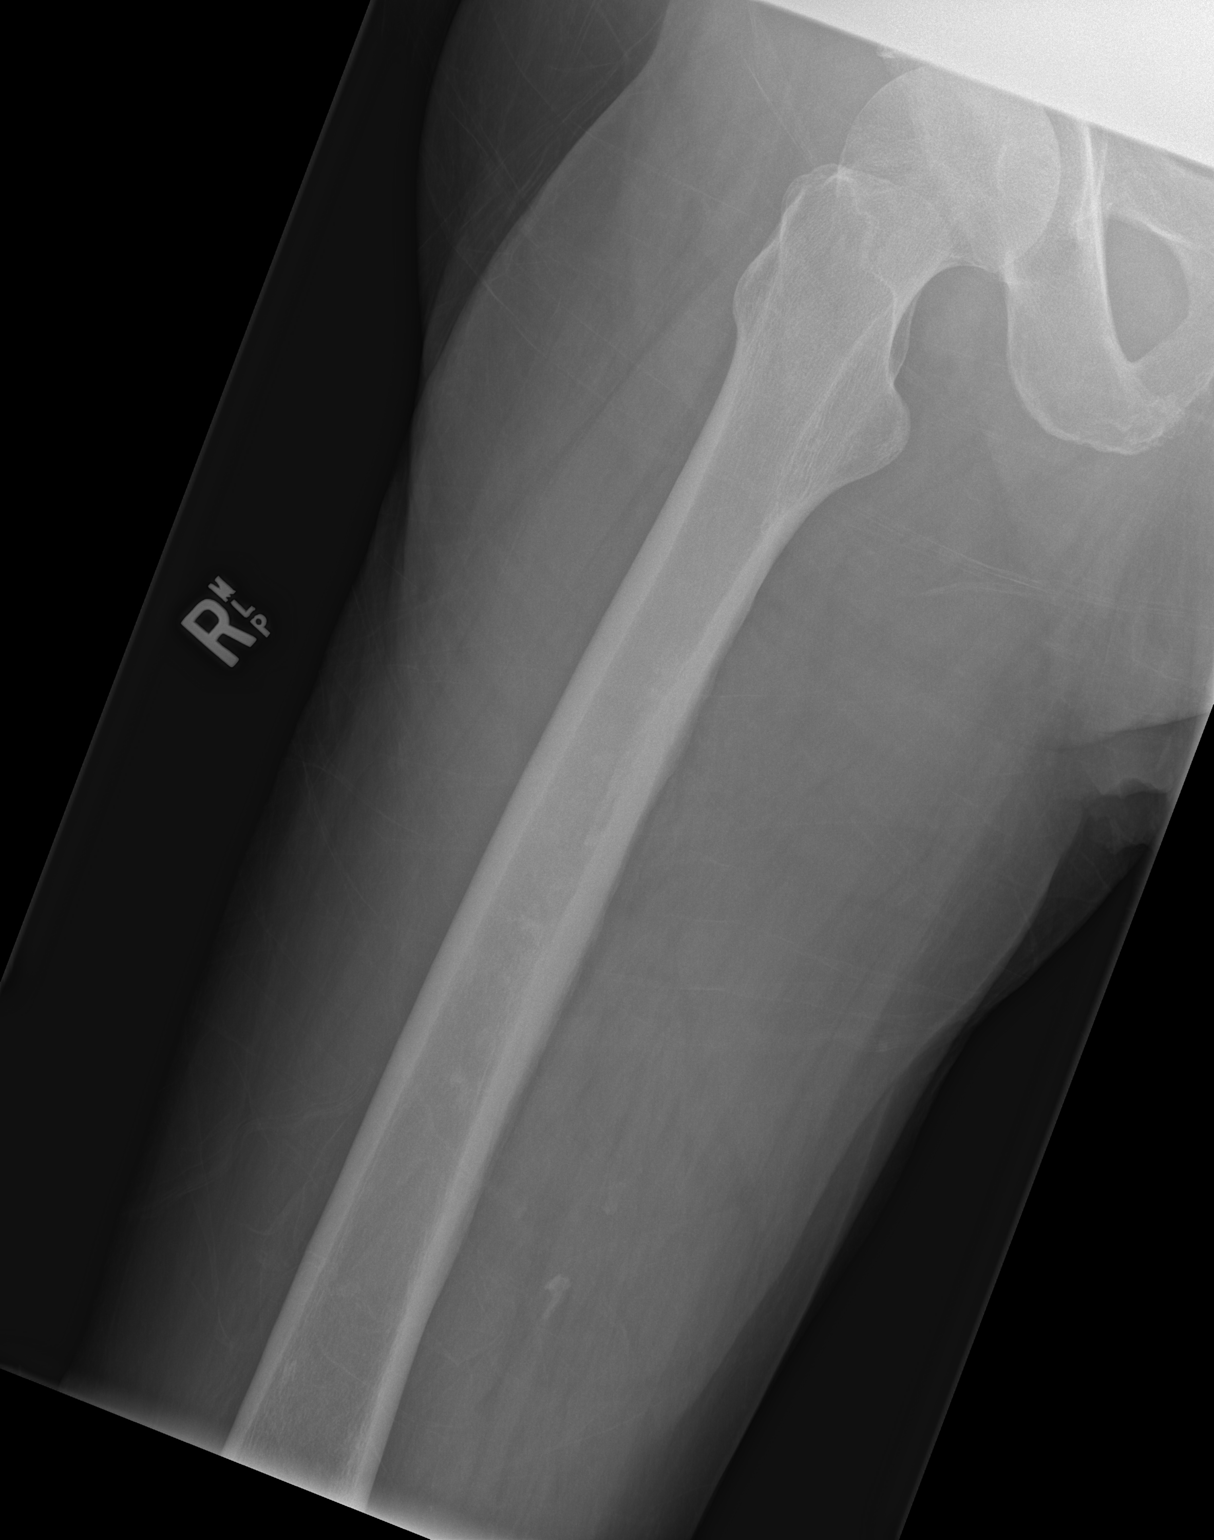

[4 of 4 positions shown; findings below may reference images not displayed]

FINDINGS: There is no acute fracture or dislocation. There is mild osteopenia.
Mild arthritic changes of the hips. Diffuse sclerotic changes of the
visualized lower lumbar spine as well as scattered sclerotic lesion
of the pelvic bones consistent with metastatic disease. Prostate
fiducial markers noted. The soft tissues are unremarkable.
IMPRESSION: 1. No acute fracture or dislocation.
2. Osseous metastatic disease primarily involving the visualized
lower lumbar spine and pelvic bone.

## 2020-09-04 ENCOUNTER — Other Ambulatory Visit: Payer: Self-pay

## 2020-09-04 ENCOUNTER — Emergency Department (HOSPITAL_COMMUNITY)
Admission: EM | Admit: 2020-09-04 | Discharge: 2020-09-05 | Disposition: A | Discharge status: Home / Self Care | Payer: Medicare HMO | Attending: Emergency Medicine | Admitting: Emergency Medicine

## 2020-09-04 ENCOUNTER — Encounter (HOSPITAL_COMMUNITY): Payer: Self-pay | Admitting: Emergency Medicine

## 2020-09-04 DIAGNOSIS — D696 Thrombocytopenia, unspecified: Secondary | ICD-10-CM | POA: Diagnosis not present

## 2020-09-04 DIAGNOSIS — I10 Essential (primary) hypertension: Secondary | ICD-10-CM | POA: Diagnosis not present

## 2020-09-04 DIAGNOSIS — K5641 Fecal impaction: Secondary | ICD-10-CM | POA: Diagnosis not present

## 2020-09-04 DIAGNOSIS — Z87891 Personal history of nicotine dependence: Secondary | ICD-10-CM | POA: Insufficient documentation

## 2020-09-04 DIAGNOSIS — E119 Type 2 diabetes mellitus without complications: Secondary | ICD-10-CM | POA: Diagnosis not present

## 2020-09-04 DIAGNOSIS — R1084 Generalized abdominal pain: Secondary | ICD-10-CM | POA: Diagnosis present

## 2020-09-04 LAB — CBC
HCT: 27.5 % — ABNORMAL LOW (ref 39.0–52.0)
Hemoglobin: 9.1 g/dL — ABNORMAL LOW (ref 13.0–17.0)
MCH: 31.2 pg (ref 26.0–34.0)
MCHC: 33.1 g/dL (ref 30.0–36.0)
MCV: 94.2 fL (ref 80.0–100.0)
Platelets: 15 10*3/uL — CL (ref 150–400)
RBC: 2.92 MIL/uL — ABNORMAL LOW (ref 4.22–5.81)
RDW: 16.9 % — ABNORMAL HIGH (ref 11.5–15.5)
WBC: 2.2 10*3/uL — ABNORMAL LOW (ref 4.0–10.5)
nRBC: 12 % — ABNORMAL HIGH (ref 0.0–0.2)

## 2020-09-04 LAB — COMPREHENSIVE METABOLIC PANEL
ALT: 15 U/L (ref 0–44)
AST: 36 U/L (ref 15–41)
Albumin: 2.6 g/dL — ABNORMAL LOW (ref 3.5–5.0)
Alkaline Phosphatase: 269 U/L — ABNORMAL HIGH (ref 38–126)
Anion gap: 12 (ref 5–15)
BUN: 15 mg/dL (ref 8–23)
CO2: 20 mmol/L — ABNORMAL LOW (ref 22–32)
Calcium: 8.5 mg/dL — ABNORMAL LOW (ref 8.9–10.3)
Chloride: 106 mmol/L (ref 98–111)
Creatinine, Ser: 0.94 mg/dL (ref 0.61–1.24)
GFR, Estimated: >60 mL/min (ref >60)
Glucose, Bld: 121 mg/dL — ABNORMAL HIGH (ref 70–99)
Potassium: 4.1 mmol/L (ref 3.5–5.1)
Sodium: 138 mmol/L (ref 135–145)
Total Bilirubin: 1.2 mg/dL (ref 0.3–1.2)
Total Protein: 5.8 g/dL — ABNORMAL LOW (ref 6.5–8.1)

## 2020-09-04 LAB — LIPASE, BLOOD: Lipase: 21 U/L (ref 11–51)

## 2020-09-04 MED ORDER — FLEET ENEMA 7-19 GM/118ML RE ENEM
1.0000 | ENEMA | Freq: Once | RECTAL | Status: AC
  Administered 2020-09-05: 1 via RECTAL
  Filled 2020-09-04: qty 1

## 2020-09-04 MED ORDER — DOCUSATE SODIUM 100 MG PO CAPS: 100.0000 mg | ORAL_CAPSULE | Freq: Two times a day (BID) | ORAL | 0 refills | Status: DC

## 2020-09-04 NOTE — ED Provider Notes (Signed)
[facility] EMERGENCY DEPARTMENT Provider Note   CSN: [id number] Arrival date & time: 09/04/20  2017     History Chief Complaint  Patient presents with  . Abdominal Pain    Constipated/Rectal Bleeding    Danny Mccall is a 74 y.o. male.  74 yo M with a chief complaint of diffuse abdominal pain and constipation.  This been a current problem for him.  Was just seen in the emergency department and diagnosed with fecal impaction.  Had a cleanout done here and had some transient improvement but then has not had a bowel movement since.  No nausea or vomiting.  No fevers.  Feels the same as his last visit here.  He tried to disimpact himself at home but had some bleeding and so was concerned about possible injury.  Has a history of prostate cancer that was thought to be in remission but has had recurrence.  On his last visit he had a CT scan that showed likely liver metastasis.  He has tried laxatives at home without improvement.  Has run out of his Colace and would like it refilled.  The history is provided by the patient and a relative.  Abdominal Pain Pain location:  Generalized Pain quality: aching and cramping   Pain radiates to:  Does not radiate Pain severity:  Severe Onset quality:  Gradual Duration:  1 week Timing:  Constant Progression:  Worsening Chronicity:  Recurrent Relieved by:  Nothing Worsened by:  Nothing Ineffective treatments:  None tried      Past Medical History:  Diagnosis Date  . Anemia   . Diabetes mellitus without complication ([place])    [id number] dont have it any longer. " Last A1C was 6.2"  . Duodenal ulcer 10/2017  . History of blood transfusion    numerous at a [facility] facility  . Hypertension   . Open-angle glaucoma    bilateral  . Panuveitis, both eyes   . Peripheral focal chorioretinal inflammation    both eyes  . Prostate cancer ([place])    Gleason 9, Bone Metastasis    Patient Active Problem List   Diagnosis  Date Noted  . Melena 11/07/2017  . H. pylori duodenitis 11/07/2017  . Prostate cancer ([place]) 11/07/2017  . BPH (benign prostatic hyperplasia) 11/07/2017  . HLD (hyperlipidemia) 11/07/2017  . Heme positive stool   . Duodenum ulcer   . Anemia 11/04/2017  . HTN (hypertension) 07/30/2013  . Diabetes ([place]) 07/30/2013    Past Surgical History:  Procedure Laterality Date  . ABDOMINAL SURGERY    . CATARACT EXTRACTION W/ INTRAOCULAR LENS  IMPLANT, BILATERAL    . CHOLECYSTECTOMY    . ESOPHAGOGASTRODUODENOSCOPY N/A 11/05/2017   Procedure: ESOPHAGOGASTRODUODENOSCOPY (EGD);  Surgeon: Jerene Bears, MD;  Location: [facility] ENDOSCOPY;  Service: Gastroenterology;  Laterality: N/A;  . EYE SURGERY Right    , cataract, surgeries for Glaucoma  . RADIOLOGY WITH ANESTHESIA N/A 08/10/2020   Procedure: MRI WITH ANESTHESIA OF BRAIN WITH AND WITHOUT CONTRAST AND MRI OF ORBITS WITH AND WITHOUT CONTRAST;  Surgeon: Radiologist, Medication, MD;  Location: [place];  Service: Radiology;  Laterality: N/A;       Family History  Problem Relation Age of Onset  . Diabetes Mother   . Cancer Father        throat  . Colon cancer Other        sister's child    Social History   Tobacco Use  . Smoking status: Former Smoker  Packs/day: 0.50    Years: 20.00    Pack years: 10.00    Types: Cigarettes    Quit date: 11/09/2016    Years since quitting: 3.8  . Smokeless tobacco: Never Used  . Tobacco comment: "smoked off and on"  Vaping Use  . Vaping Use: Never used  Substance Use Topics  . Alcohol use: Not Currently  . Drug use: No    Home Medications Prior to Admission medications   Medication Sig Start Date End Date Taking? Authorizing Provider  diclofenac Sodium (VOLTAREN) 1 % GEL Apply 2 g topically daily as needed (pain).   Yes [provider]  ketorolac (ACULAR) 0.5 % ophthalmic solution Place 1 drop into both eyes in the morning, at noon, in the evening, and at bedtime. 07/16/20  Yes [provider]  LOTEMAX SM 0.38 % GEL Place 1 drop into both eyes 4 (four) times daily. 06/08/20  Yes [provider]  oxyCODONE (OXY IR/ROXICODONE) 5 MG immediate release tablet Take 5 mg by mouth 2 (two) times daily as needed for moderate pain or severe pain.    Yes [provider]  polyethylene glycol (MIRALAX) 17 g packet Take 17 g by mouth daily as needed (constipation). 05/05/19  Yes McDonald, Mia A, PA-C  ROCKLATAN 0.02-0.005 % SOLN Place 1 drop into the left eye at bedtime. 06/09/20  Yes [provider]  SIMBRINZA 1-0.2 % SUSP Place 1 drop into both eyes in the morning, at noon, and at bedtime.  08/24/17  Yes [provider]    Allergies    Penicillins  Review of Systems   Review of Systems  Gastrointestinal: Positive for abdominal pain.    Physical Exam Updated Vital Signs BP (!) 109/56   Pulse 77   Temp 97.6 F (36.4 C) (Oral)   Resp 16   SpO2 100%   Physical Exam Vitals and nursing note reviewed.  Constitutional:      Appearance: He is well-developed and well-nourished.  HENT:     Head: Normocephalic and atraumatic.  Eyes:     Extraocular Movements: EOM normal.     Pupils: Pupils are equal, round, and reactive to light.  Neck:     Vascular: No JVD.  Cardiovascular:     Rate and Rhythm: Normal rate and regular rhythm.     Heart sounds: No murmur heard. No friction rub. No gallop.   Pulmonary:     Effort: No respiratory distress.     Breath sounds: No wheezing.  Abdominal:     General: There is no distension.     Tenderness: There is no abdominal tenderness. There is no guarding or rebound.     Comments: No significant abdominal discomfort on exam.  Genitourinary:    Comments: No hemorrhoids no fissures no obvious signs of trauma to the rectum.  Large amounts of clay consistency stool in the rectum. Musculoskeletal:        General: Normal range of motion.     Cervical back: Normal range of motion and neck supple.  Skin:     Coloration: Skin is not pale.     Findings: No rash.  Neurological:     Mental Status: He is alert and oriented to person, place, and time.  Psychiatric:        Mood and Affect: Mood and affect normal.        Behavior: Behavior normal.     ED Results / Procedures / Treatments   Labs (all labs ordered  are listed, but only abnormal results are displayed) Labs Reviewed  COMPREHENSIVE METABOLIC PANEL - Abnormal; Notable for the following components:      Result Value   CO2 20 (*)    Glucose, Bld 121 (*)    Calcium 8.5 (*)    Total Protein 5.8 (*)    Albumin 2.6 (*)    Alkaline Phosphatase 269 (*)    All other components within normal limits  CBC - Abnormal; Notable for the following components:   WBC 2.2 (*)    RBC 2.92 (*)    Hemoglobin 9.1 (*)    HCT 27.5 (*)    RDW 16.9 (*)    Platelets 15 (*)    nRBC 12.0 (*)    All other components within normal limits  LIPASE, BLOOD  TYPE AND SCREEN  PREPARE PLATELET PHERESIS    EKG None  Radiology No results found.  Procedures Fecal disimpaction  Date/Time: 09/04/2020 10:47 PM Performed by: Deno Etienne, DO Authorized by: Deno Etienne, DO  Consent: Verbal consent obtained. Risks and benefits: risks, benefits and alternatives were discussed Consent given by: patient (family member) Patient understanding: patient states understanding of the procedure being performed Imaging studies: imaging studies not available Required items: required blood products, implants, devices, and special equipment available Patient identity confirmed: verbally with patient Time out: Immediately prior to procedure a "time out" was called to verify the correct patient, procedure, equipment, support staff and site/side marked as required. Local anesthesia used: no  Anesthesia: Local anesthesia used: no  Sedation: Patient sedated: no  Patient tolerance: patient tolerated the procedure well with no immediate complications    (including critical  care time)  Medications Ordered in ED Medications  sodium phosphate (FLEET) 7-19 GM/118ML enema 1 enema (1 enema Rectal Given 09/05/20 0041)    ED Course  I have reviewed the triage vital signs and the nursing notes.  Pertinent labs & imaging results that were available during my care of the patient were reviewed by me and considered in my medical decision making (see chart for details).    MDM Rules/Calculators/A&P                          74 yo M with a chief complaints of constipation.  Going on for the past week.  Was just seen in the hospital a week ago and had a disimpaction performed and afterwards felt better but had not had a bowel movement since.  Has tried MiraLAX without significant improvement.  On exam here patient has fecal impaction clinically.  Large amounts removed.  Will give an enema here.  Reassess.  Signed out to Dr. Dayna Barker, likely D/c home post enema.    The patients results and plan were reviewed and discussed.   Any x-rays performed were independently reviewed by myself.   Differential diagnosis were considered with the presenting HPI.  Medications  sodium phosphate (FLEET) 7-19 GM/118ML enema 1 enema (1 enema Rectal Given 09/05/20 0041)    Vitals:   09/05/20 0300 09/05/20 0408 09/05/20 0434 09/05/20 0445  BP: 103/70 (!) 96/57 104/71 (!) 109/56  Pulse: 87 91 87 77  Resp: 16 16 16 16   Temp: 97.8 F (36.6 C) 97.6 F (36.4 C) 97.6 F (36.4 C) 97.6 F (36.4 C)  TempSrc: Oral Oral Oral Oral  SpO2: 100% 100%  100%    Final diagnoses:  Fecal impaction in rectum (HCC)  Thrombocytopenia ([place])      Final  Clinical Impression(s) / ED Diagnoses Final diagnoses:  Fecal impaction in rectum ([place])  Thrombocytopenia ([place])    Rx / DC Orders ED Discharge Orders         Ordered    docusate sodium (COLACE) 100 MG capsule  2 times daily,   Status:  Discontinued        09/04/20 2247           Deno Etienne, DO 09/05/20 1505

## 2020-09-04 NOTE — ED Triage Notes (Signed)
Patient reports constipation with generalized abdominal pain , enema at home this evening with no relief , patient added rectal bleeding after attempted manual disimpaction by patient .  Marland Kitchen

## 2020-09-04 NOTE — Discharge Instructions (Addendum)
Take 8 scoops of miralax in 32oz of whatever you would like to drink.(Gatorade comes in this size) You can also use a fleets enema which you can buy over the counter at the pharmacy.  Return for worsening abdominal pain, vomiting or fever. ? ?

## 2020-09-05 DIAGNOSIS — K5641 Fecal impaction: Secondary | ICD-10-CM | POA: Diagnosis not present

## 2020-09-05 LAB — TYPE AND SCREEN
ABO/RH(D): A POS
Antibody Screen: NEGATIVE

## 2020-09-05 MED ORDER — SODIUM CHLORIDE 0.9% IV SOLUTION: Freq: Once | INTRAVENOUS | Status: DC

## 2020-09-05 NOTE — ED Notes (Signed)
Pt given paper pants to wear home per request.

## 2020-09-05 NOTE — ED Notes (Signed)
Enema given with positive result. Patient with a few medium size stools immediately following enema administration. Patient placed on bed side commode to continue using the bathroom. Will continue to monitor.

## 2020-09-06 LAB — BPAM PLATELET PHERESIS
Blood Product Expiration Date: 202112272359
Blood Product Expiration Date: 202112282359
ISSUE DATE / TIME: 202112260356
ISSUE DATE / TIME: 202112260356
Unit Type and Rh: 6200
Unit Type and Rh: 9500

## 2020-09-06 LAB — PREPARE PLATELET PHERESIS
Unit division: 0
Unit division: 0

## 2020-09-21 ENCOUNTER — Emergency Department (HOSPITAL_COMMUNITY)
Admission: EM | Admit: 2020-09-21 | Discharge: 2020-09-22 | Disposition: A | Discharge status: Home / Self Care | Payer: Medicare HMO | Attending: Emergency Medicine | Admitting: Emergency Medicine

## 2020-09-21 ENCOUNTER — Encounter (HOSPITAL_COMMUNITY): Payer: Self-pay

## 2020-09-21 DIAGNOSIS — Z87891 Personal history of nicotine dependence: Secondary | ICD-10-CM | POA: Insufficient documentation

## 2020-09-21 DIAGNOSIS — I959 Hypotension, unspecified: Secondary | ICD-10-CM | POA: Diagnosis present

## 2020-09-21 DIAGNOSIS — D649 Anemia, unspecified: Secondary | ICD-10-CM | POA: Insufficient documentation

## 2020-09-21 DIAGNOSIS — D696 Thrombocytopenia, unspecified: Secondary | ICD-10-CM

## 2020-09-21 DIAGNOSIS — C799 Secondary malignant neoplasm of unspecified site: Secondary | ICD-10-CM | POA: Insufficient documentation

## 2020-09-21 DIAGNOSIS — Z8546 Personal history of malignant neoplasm of prostate: Secondary | ICD-10-CM | POA: Insufficient documentation

## 2020-09-21 DIAGNOSIS — Z20822 Contact with and (suspected) exposure to covid-19: Secondary | ICD-10-CM | POA: Insufficient documentation

## 2020-09-21 DIAGNOSIS — E119 Type 2 diabetes mellitus without complications: Secondary | ICD-10-CM | POA: Diagnosis not present

## 2020-09-21 LAB — BASIC METABOLIC PANEL
Anion gap: 10 (ref 5–15)
BUN: 22 mg/dL (ref 8–23)
CO2: 21 mmol/L — ABNORMAL LOW (ref 22–32)
Calcium: 8.3 mg/dL — ABNORMAL LOW (ref 8.9–10.3)
Chloride: 107 mmol/L (ref 98–111)
Creatinine, Ser: 0.96 mg/dL (ref 0.61–1.24)
GFR, Estimated: >60 mL/min (ref >60)
Glucose, Bld: 138 mg/dL — ABNORMAL HIGH (ref 70–99)
Potassium: 4.1 mmol/L (ref 3.5–5.1)
Sodium: 138 mmol/L (ref 135–145)

## 2020-09-21 LAB — CBC WITH DIFFERENTIAL/PLATELET
Abs Immature Granulocytes: 0.18 10*3/uL — ABNORMAL HIGH (ref 0.00–0.07)
Basophils Absolute: 0 10*3/uL (ref 0.0–0.1)
Basophils Relative: 0 %
Eosinophils Absolute: 0 10*3/uL (ref 0.0–0.5)
Eosinophils Relative: 0 %
HCT: 18.5 % — ABNORMAL LOW (ref 39.0–52.0)
Hemoglobin: 6 g/dL — CL (ref 13.0–17.0)
Immature Granulocytes: 7 %
Lymphocytes Relative: 41 %
Lymphs Abs: 1.1 10*3/uL (ref 0.7–4.0)
MCH: 31.1 pg (ref 26.0–34.0)
MCHC: 32.4 g/dL (ref 30.0–36.0)
MCV: 95.9 fL (ref 80.0–100.0)
Monocytes Absolute: 0.4 10*3/uL (ref 0.1–1.0)
Monocytes Relative: 14 %
Neutro Abs: 1.1 10*3/uL — ABNORMAL LOW (ref 1.7–7.7)
Neutrophils Relative %: 38 %
Platelets: 15 10*3/uL — CL (ref 150–400)
RBC: 1.93 MIL/uL — ABNORMAL LOW (ref 4.22–5.81)
RDW: 16.9 % — ABNORMAL HIGH (ref 11.5–15.5)
WBC: 2.7 10*3/uL — ABNORMAL LOW (ref 4.0–10.5)
nRBC: 15 % — ABNORMAL HIGH (ref 0.0–0.2)

## 2020-09-21 LAB — URINALYSIS, ROUTINE W REFLEX MICROSCOPIC
Bilirubin Urine: NEGATIVE
Glucose, UA: NEGATIVE mg/dL
Hgb urine dipstick: NEGATIVE
Ketones, ur: NEGATIVE mg/dL
Leukocytes,Ua: NEGATIVE
Nitrite: NEGATIVE
Protein, ur: NEGATIVE mg/dL
Specific Gravity, Urine: 1.012 (ref 1.005–1.030)
pH: 6 (ref 5.0–8.0)

## 2020-09-21 LAB — PREPARE RBC (CROSSMATCH)

## 2020-09-21 MED ORDER — SODIUM CHLORIDE 0.9 % IV BOLUS
500.0000 mL | Freq: Once | INTRAVENOUS | Status: AC
  Administered 2020-09-21: 500 mL via INTRAVENOUS

## 2020-09-21 MED ORDER — SODIUM CHLORIDE 0.9 % IV SOLN
10.0000 mL/h | Freq: Once | INTRAVENOUS | Status: AC
  Administered 2020-09-21: 10 mL/h via INTRAVENOUS

## 2020-09-21 MED ORDER — OXYCODONE-ACETAMINOPHEN 5-325 MG PO TABS
2.0000 | ORAL_TABLET | Freq: Once | ORAL | Status: AC
Start: 2020-09-21 — End: 2020-09-21
  Administered 2020-09-21: 2 via ORAL
  Filled 2020-09-21: qty 2

## 2020-09-21 NOTE — ED Provider Notes (Signed)
Signed out by Dr Eulis Foster at 1800, that patient with chronic/worsening transfusion dependent anemia/pancytopenia, stage IV widely metastatic prostate ca, and that he is a palliative care patient that is being transfused periodically. He indicates plts at baseline, and no acute blood loss, and the plan he has discussed w pt/fam is due transfuse 2 units prbc in ED and d/c to home.  On recheck at 2330, first unit has just finished. Current bp is in mid 90s. It appears patients baseline bp is ~ 100/. Pt denies any pain. No nv. Denies blood loss. He denies feeling faint or dizzy. No fever or chills. No sob. Stool is light yellowish/brown, heme neg.   Signed out to Dr Christy Gentles to reassess after PRBC transfusion. Anticipate probable d/c to home with palliative medicine follow up as outpatient.    Lajean Saver, MD 09/21/20 [phone]

## 2020-09-21 NOTE — ED Provider Notes (Signed)
[name] Provider Note   CSN: [id number] Arrival date & time: 09/21/20  1505     History Chief Complaint  Patient presents with  . Hypotension    Danny Mccall is Mccall 75 y.o. male.  HPI Patient is here for evaluation and treatment of hypotension.  He was at home today when Mccall physical therapist assessed him and found his blood pressure to be low, so called EMS who transferred him here.  Patient is concerned that his blood count is low, because that is happened previously, causing him to feel weak.  He has metastatic prostate cancer and is currently in palliative status.  He recently had in-home assessment by palliative provider, and physical therapy had been called in to help stabilize his home situation.  There has been no recent illnesses.  He is here with his wife who helps care for him.  No recent trouble with ambulation, or significant altered oral intake.  There are no other known modifying factors.  Past Medical History:  Diagnosis Date  . Anemia   . Diabetes mellitus without complication ([place])    62/13/08"M dont have it any longer. " Last A1C was 6.2"  . Duodenal ulcer 10/2017  . History of blood transfusion    numerous at [facility] facility  . Hypertension   . Open-angle glaucoma    bilateral  . Panuveitis, both eyes   . Peripheral focal chorioretinal inflammation    both eyes  . Prostate cancer ([place])    Gleason 9, Bone Metastasis    Patient Active Problem List   Diagnosis Date Noted  . Melena 11/07/2017  . H. pylori duodenitis 11/07/2017  . Prostate cancer ([place]) 11/07/2017  . BPH (benign prostatic hyperplasia) 11/07/2017  . HLD (hyperlipidemia) 11/07/2017  . Heme positive stool   . Duodenum ulcer   . Anemia 11/04/2017  . HTN (hypertension) 07/30/2013  . Diabetes ([place]) 07/30/2013    Past Surgical History:  Procedure Laterality Date  . ABDOMINAL SURGERY    . CATARACT EXTRACTION W/ INTRAOCULAR LENS  IMPLANT,  BILATERAL    . CHOLECYSTECTOMY    . ESOPHAGOGASTRODUODENOSCOPY N/Mccall 11/05/2017   Procedure: ESOPHAGOGASTRODUODENOSCOPY (EGD);  Surgeon: Danny Bears, MD;  Location: [facility] ENDOSCOPY;  Service: Gastroenterology;  Laterality: N/Mccall;  . EYE SURGERY Right    , cataract, surgeries for Glaucoma  . RADIOLOGY WITH ANESTHESIA N/Mccall 08/10/2020   Procedure: MRI WITH ANESTHESIA OF BRAIN WITH AND WITHOUT CONTRAST AND MRI OF ORBITS WITH AND WITHOUT CONTRAST;  Surgeon: Radiologist, Medication, MD;  Location: [place];  Service: Radiology;  Laterality: N/Mccall;       Family History  Problem Relation Age of Onset  . Diabetes Mother   . Cancer Father        throat  . Colon cancer Other        sister's child    Social History   Tobacco Use  . Smoking status: Former Smoker    Packs/day: 0.50    Years: 20.00    Pack years: 10.00    Types: Cigarettes    Quit date: 11/09/2016    Years since quitting: 3.8  . Smokeless tobacco: Never Used  . Tobacco comment: "smoked off and on"  Vaping Use  . Vaping Use: Never used  Substance Use Topics  . Alcohol use: Not Currently  . Drug use: No    Home Medications Prior to Admission medications   Medication Sig Start Date End Date Taking? Authorizing Provider  diclofenac Sodium (  VOLTAREN) 1 % GEL Apply 2 g topically daily as needed (pain).    [provider]  ketorolac (ACULAR) 0.5 % ophthalmic solution Place 1 drop into both eyes in the morning, at noon, in the evening, and at bedtime. 07/16/20   [provider]  LOTEMAX SM 0.38 % GEL Place 1 drop into both eyes 4 (four) times daily. 06/08/20   [provider]  oxyCODONE (OXY IR/ROXICODONE) 5 MG immediate release tablet Take 5 mg by mouth 2 (two) times daily as needed for moderate pain or severe pain.     [provider]  polyethylene glycol (MIRALAX) 17 g packet Take 17 g by mouth daily as needed (constipation). 05/05/19   McDonald, Danny A, PA-C  ROCKLATAN 0.02-0.005 % SOLN Place 1 drop  into the left eye at bedtime. 06/09/20   [provider]  SIMBRINZA 1-0.2 % SUSP Place 1 drop into both eyes in the morning, at noon, and at bedtime.  08/24/17   [provider]    Allergies    Penicillins  Review of Systems   Review of Systems  All other systems reviewed and are negative.   Physical Exam Updated Vital Signs BP (!) 87/63   Pulse 87   Temp 98.3 F (36.8 C) (Oral)   Resp 18   SpO2 100%   Physical Exam  ED Results / Procedures / Treatments   Labs (all labs ordered are listed, but only abnormal results are displayed) Labs Reviewed  BASIC METABOLIC PANEL - Abnormal; Notable for the following components:      Result Value   CO2 21 (*)    Glucose, Bld 138 (*)    Calcium 8.3 (*)    All other components within normal limits  CBC WITH DIFFERENTIAL/PLATELET - Abnormal; Notable for the following components:   WBC 2.7 (*)    RBC 1.93 (*)    Hemoglobin 6.0 (*)    HCT 18.5 (*)    RDW 16.9 (*)    Platelets 15 (*)    nRBC 15.0 (*)    Neutro Abs 1.1 (*)    Abs Immature Granulocytes 0.18 (*)    All other components within normal limits  SARS CORONAVIRUS 2 (TAT 6-24 HRS)  URINALYSIS, ROUTINE W REFLEX MICROSCOPIC  TYPE AND SCREEN  PREPARE RBC (CROSSMATCH)    EKG None  Radiology No results found.  Procedures .Critical Care Performed by: Danny Bo, MD Authorized by: Danny Bo, MD   Critical care provider statement:    Critical care time (minutes):  35   Critical care start time:  09/21/2020 3:45 PM   Critical care end time:  09/21/2020 6:15 PM   Critical care time was exclusive of:  Separately billable procedures and treating other patients   Critical care was necessary to treat or prevent imminent or life-threatening deterioration of the following conditions:  Circulatory failure   Critical care was time spent personally by me on the following activities:  Blood draw for specimens, development of treatment plan with patient or  surrogate, discussions with consultants, evaluation of patient's response to treatment, examination of patient, obtaining history from patient or surrogate, ordering and performing treatments and interventions, ordering and review of laboratory studies, pulse oximetry, re-evaluation of patient's condition, review of old charts and ordering and review of radiographic studies   (including critical care time)  Medications Ordered in ED Medications  0.9 %  sodium chloride infusion (has no administration in time range)  sodium chloride 0.9 % bolus 500  mL (500 mLs Intravenous New Bag/Given 09/21/20 1627)  oxyCODONE-acetaminophen (PERCOCET/ROXICET) 5-325 MG per tablet 2 tablet (2 tablets Oral Given 09/21/20 1646)    ED Course  I have reviewed the triage vital signs and the nursing notes.  Pertinent labs & imaging results that were available during my care of the patient were reviewed by me and considered in my medical decision making (see chart for details).    MDM Rules/Calculators/Mccall&P                           Patient Vitals for the past 24 hrs:  BP Temp Temp src Pulse Resp SpO2  09/21/20 1730 (!) 87/63 - - 87 18 100 %  09/21/20 1600 95/71 - - 90 (!) 22 100 %  09/21/20 1519 (!) 86/54 98.3 F (36.8 C) Oral 97 18 100 %    6:07 PM Reevaluation with update and discussion. After initial assessment and treatment, an updated evaluation reveals patient is somewhat sedated after narcotic medication given.  Findings discussed with patient and wife.  They are agreeable to going home after blood transfusion.  His wife asked about getting platelets and I explained that typically these are not given in the last there is active bleeding, or they are less than 10.Danny Mccall   Medical Decision Making:  This patient is presenting for evaluation of hypotension, which does require Mccall range of treatment options, and is Mccall complaint that involves Mccall moderate risk of morbidity and mortality. The differential  diagnoses include worsening metastatic prostate cancer.  I decided to review old records, and in summary  Patient recently entered into palliative status, for cancer.  He is not receiving active treatment for cancer.  I obtained additional historical information from his wife at the bedside.  Clinical Laboratory Tests Ordered, included CBC, Metabolic panel and Urinalysis. Review indicates white count low, hemoglobin low, platelets low, CO2 low, glucose, calcium low.  Critical Interventions-clinical evaluation, laboratory testing, narcotic medication, observation, blood transfusions ordered  After These Interventions, the Patient was reevaluated and was found with significant anemia, which is recurrent, requiring transfusion.  Nonspecific hypertension, mild but not causing significant problems.  Treated with IV fluids and blood transfusion to improve status.  Incidental low platelets, without active bleeding.  CRITICAL CARE-yes Performed by: Danny Mccall  Nursing Notes Reviewed/ Care Coordinated Applicable Imaging Reviewed Interpretation of Laboratory Data incorporated into ED treatment   Plan discharge after 2 units packed blood cell administration  Anticipate continuing home care with palliative status.    Final Clinical Impression(s) / ED Diagnoses Final diagnoses:  Hypotension, unspecified hypotension type  Anemia, unspecified type  Thrombocytopenia ([place])  Metastatic malignant neoplasm, unspecified site [facility])    Rx / [place] Orders ED Discharge Orders    None       Danny Bo, MD 09/21/20 1816

## 2020-09-21 NOTE — Discharge Instructions (Addendum)
It was our pleasure to provide your ER care today - we hope that you feel better.  Follow up closely with your doctor and the palliative medicine team in the next couple of days.   Return to ER if worse, new symptoms, fevers, new or severe pain, bleeding, weak/fainting, trouble breathing, or other concern.

## 2020-09-21 NOTE — ED Triage Notes (Signed)
Pt presents with c/o hypotension. Pt had a home visit with his physical therapist and was told he is hypotensive. EMS was called to try and get an IV and get him some hydration to avoid the ER but they were unable to get an IV. Pt is a cancer pt, hypotensive in triage.

## 2020-09-21 NOTE — ED Notes (Signed)
Patient given drink and snacks.   Sacrum pad placed on patient.Sacrum wound found on patient about the size of quarter.   Urinal at bedside and patient made aware we need UA.   Wife at bedside.   Informed consent for blood signed.

## 2020-09-21 NOTE — ED Notes (Signed)
Critical Value Hemoglobin-6.0 Platelet-15

## 2020-09-21 NOTE — ED Notes (Signed)
Wife at bedside.

## 2020-09-22 DIAGNOSIS — I959 Hypotension, unspecified: Secondary | ICD-10-CM | POA: Diagnosis not present

## 2020-09-22 LAB — BPAM RBC
Blood Product Expiration Date: 202201262359
Blood Product Expiration Date: 202201262359
ISSUE DATE / TIME: 202201111942
ISSUE DATE / TIME: 202201112306
Unit Type and Rh: 6200
Unit Type and Rh: 6200

## 2020-09-22 LAB — TYPE AND SCREEN
ABO/RH(D): A POS
Antibody Screen: NEGATIVE
Unit division: 0
Unit division: 0

## 2020-09-22 LAB — SARS CORONAVIRUS 2 (TAT 6-24 HRS): SARS Coronavirus 2: NEGATIVE

## 2020-09-22 NOTE — ED Provider Notes (Signed)
Per nursing, patient has tolerated blood transfusion very well. BP 104/60   Pulse 73   Temp (!) 97.5 F (36.4 C) (Oral)   Resp 12   SpO2 97%  Vitals are appropriate. Patient is requesting discharge home   Ripley Fraise, MD 09/22/20 [phone]

## 2020-10-08 ENCOUNTER — Inpatient Hospital Stay (HOSPITAL_COMMUNITY): Payer: Medicare HMO

## 2020-10-08 ENCOUNTER — Inpatient Hospital Stay (HOSPITAL_COMMUNITY)
Admission: EM | Admit: 2020-10-08 | Discharge: 2020-10-13 | DRG: 808 | Disposition: A | Discharge status: Hospice | Payer: Medicare HMO | Attending: Student | Admitting: Student

## 2020-10-08 ENCOUNTER — Emergency Department (HOSPITAL_COMMUNITY): Payer: Medicare HMO

## 2020-10-08 ENCOUNTER — Other Ambulatory Visit: Payer: Self-pay

## 2020-10-08 DIAGNOSIS — R54 Age-related physical debility: Secondary | ICD-10-CM | POA: Diagnosis present

## 2020-10-08 DIAGNOSIS — R042 Hemoptysis: Secondary | ICD-10-CM | POA: Diagnosis present

## 2020-10-08 DIAGNOSIS — D638 Anemia in other chronic diseases classified elsewhere: Secondary | ICD-10-CM | POA: Diagnosis not present

## 2020-10-08 DIAGNOSIS — D6959 Other secondary thrombocytopenia: Secondary | ICD-10-CM | POA: Diagnosis present

## 2020-10-08 DIAGNOSIS — C7951 Secondary malignant neoplasm of bone: Secondary | ICD-10-CM | POA: Diagnosis present

## 2020-10-08 DIAGNOSIS — J9601 Acute respiratory failure with hypoxia: Secondary | ICD-10-CM | POA: Diagnosis present

## 2020-10-08 DIAGNOSIS — E162 Hypoglycemia, unspecified: Secondary | ICD-10-CM | POA: Diagnosis not present

## 2020-10-08 DIAGNOSIS — I10 Essential (primary) hypertension: Secondary | ICD-10-CM | POA: Diagnosis present

## 2020-10-08 DIAGNOSIS — I9589 Other hypotension: Secondary | ICD-10-CM | POA: Diagnosis not present

## 2020-10-08 DIAGNOSIS — Z681 Body mass index (BMI) 19 or less, adult: Secondary | ICD-10-CM

## 2020-10-08 DIAGNOSIS — Z20822 Contact with and (suspected) exposure to covid-19: Secondary | ICD-10-CM | POA: Diagnosis present

## 2020-10-08 DIAGNOSIS — Z800 Family history of malignant neoplasm of digestive organs: Secondary | ICD-10-CM

## 2020-10-08 DIAGNOSIS — E861 Hypovolemia: Secondary | ICD-10-CM | POA: Diagnosis present

## 2020-10-08 DIAGNOSIS — C61 Malignant neoplasm of prostate: Secondary | ICD-10-CM | POA: Diagnosis present

## 2020-10-08 DIAGNOSIS — R579 Shock, unspecified: Secondary | ICD-10-CM | POA: Diagnosis present

## 2020-10-08 DIAGNOSIS — C7931 Secondary malignant neoplasm of brain: Secondary | ICD-10-CM | POA: Diagnosis present

## 2020-10-08 DIAGNOSIS — L8990 Pressure ulcer of unspecified site, unspecified stage: Secondary | ICD-10-CM | POA: Insufficient documentation

## 2020-10-08 DIAGNOSIS — J918 Pleural effusion in other conditions classified elsewhere: Secondary | ICD-10-CM | POA: Diagnosis present

## 2020-10-08 DIAGNOSIS — E11649 Type 2 diabetes mellitus with hypoglycemia without coma: Secondary | ICD-10-CM | POA: Diagnosis present

## 2020-10-08 DIAGNOSIS — L89152 Pressure ulcer of sacral region, stage 2: Secondary | ICD-10-CM | POA: Diagnosis present

## 2020-10-08 DIAGNOSIS — D6189 Other specified aplastic anemias and other bone marrow failure syndromes: Principal | ICD-10-CM | POA: Diagnosis present

## 2020-10-08 DIAGNOSIS — E860 Dehydration: Secondary | ICD-10-CM | POA: Diagnosis present

## 2020-10-08 DIAGNOSIS — D696 Thrombocytopenia, unspecified: Secondary | ICD-10-CM | POA: Diagnosis not present

## 2020-10-08 DIAGNOSIS — R64 Cachexia: Secondary | ICD-10-CM | POA: Diagnosis present

## 2020-10-08 DIAGNOSIS — E1140 Type 2 diabetes mellitus with diabetic neuropathy, unspecified: Secondary | ICD-10-CM | POA: Diagnosis present

## 2020-10-08 DIAGNOSIS — A419 Sepsis, unspecified organism: Secondary | ICD-10-CM

## 2020-10-08 DIAGNOSIS — I959 Hypotension, unspecified: Secondary | ICD-10-CM

## 2020-10-08 DIAGNOSIS — D649 Anemia, unspecified: Secondary | ICD-10-CM

## 2020-10-08 DIAGNOSIS — Z515 Encounter for palliative care: Secondary | ICD-10-CM

## 2020-10-08 DIAGNOSIS — R571 Hypovolemic shock: Secondary | ICD-10-CM | POA: Diagnosis present

## 2020-10-08 DIAGNOSIS — C787 Secondary malignant neoplasm of liver and intrahepatic bile duct: Secondary | ICD-10-CM | POA: Diagnosis present

## 2020-10-08 DIAGNOSIS — Z66 Do not resuscitate: Secondary | ICD-10-CM | POA: Diagnosis present

## 2020-10-08 DIAGNOSIS — Z87891 Personal history of nicotine dependence: Secondary | ICD-10-CM

## 2020-10-08 DIAGNOSIS — Z923 Personal history of irradiation: Secondary | ICD-10-CM

## 2020-10-08 DIAGNOSIS — H409 Unspecified glaucoma: Secondary | ICD-10-CM | POA: Diagnosis present

## 2020-10-08 DIAGNOSIS — N179 Acute kidney failure, unspecified: Secondary | ICD-10-CM | POA: Diagnosis present

## 2020-10-08 DIAGNOSIS — E872 Acidosis: Secondary | ICD-10-CM | POA: Diagnosis present

## 2020-10-08 DIAGNOSIS — J939 Pneumothorax, unspecified: Secondary | ICD-10-CM | POA: Diagnosis not present

## 2020-10-08 DIAGNOSIS — Z79899 Other long term (current) drug therapy: Secondary | ICD-10-CM

## 2020-10-08 DIAGNOSIS — J9311 Primary spontaneous pneumothorax: Secondary | ICD-10-CM | POA: Diagnosis not present

## 2020-10-08 DIAGNOSIS — E785 Hyperlipidemia, unspecified: Secondary | ICD-10-CM | POA: Diagnosis present

## 2020-10-08 DIAGNOSIS — E43 Unspecified severe protein-calorie malnutrition: Secondary | ICD-10-CM | POA: Diagnosis present

## 2020-10-08 DIAGNOSIS — J9383 Other pneumothorax: Secondary | ICD-10-CM | POA: Diagnosis present

## 2020-10-08 DIAGNOSIS — D72819 Decreased white blood cell count, unspecified: Secondary | ICD-10-CM | POA: Diagnosis present

## 2020-10-08 DIAGNOSIS — D630 Anemia in neoplastic disease: Secondary | ICD-10-CM | POA: Diagnosis present

## 2020-10-08 DIAGNOSIS — R8271 Bacteriuria: Secondary | ICD-10-CM | POA: Diagnosis present

## 2020-10-08 DIAGNOSIS — T451X5A Adverse effect of antineoplastic and immunosuppressive drugs, initial encounter: Secondary | ICD-10-CM | POA: Diagnosis present

## 2020-10-08 DIAGNOSIS — J90 Pleural effusion, not elsewhere classified: Secondary | ICD-10-CM

## 2020-10-08 DIAGNOSIS — G622 Polyneuropathy due to other toxic agents: Secondary | ICD-10-CM | POA: Diagnosis present

## 2020-10-08 DIAGNOSIS — H4010X0 Unspecified open-angle glaucoma, stage unspecified: Secondary | ICD-10-CM | POA: Diagnosis present

## 2020-10-08 DIAGNOSIS — L89302 Pressure ulcer of unspecified buttock, stage 2: Secondary | ICD-10-CM | POA: Diagnosis not present

## 2020-10-08 DIAGNOSIS — Z8711 Personal history of peptic ulcer disease: Secondary | ICD-10-CM

## 2020-10-08 DIAGNOSIS — Z4682 Encounter for fitting and adjustment of non-vascular catheter: Secondary | ICD-10-CM

## 2020-10-08 DIAGNOSIS — Z87442 Personal history of urinary calculi: Secondary | ICD-10-CM

## 2020-10-08 DIAGNOSIS — Z7189 Other specified counseling: Secondary | ICD-10-CM | POA: Diagnosis not present

## 2020-10-08 DIAGNOSIS — Z880 Allergy status to penicillin: Secondary | ICD-10-CM

## 2020-10-08 DIAGNOSIS — Z833 Family history of diabetes mellitus: Secondary | ICD-10-CM

## 2020-10-08 DIAGNOSIS — C7982 Secondary malignant neoplasm of genital organs: Secondary | ICD-10-CM | POA: Diagnosis not present

## 2020-10-08 DIAGNOSIS — L309 Dermatitis, unspecified: Secondary | ICD-10-CM | POA: Diagnosis present

## 2020-10-08 LAB — CBC WITH DIFFERENTIAL/PLATELET
Abs Immature Granulocytes: 0.28 10*3/uL — ABNORMAL HIGH (ref 0.00–0.07)
Basophils Absolute: 0 10*3/uL (ref 0.0–0.1)
Basophils Relative: 1 %
Eosinophils Absolute: 0 10*3/uL (ref 0.0–0.5)
Eosinophils Relative: 0 %
HCT: 17.6 % — ABNORMAL LOW (ref 39.0–52.0)
Hemoglobin: 5.6 g/dL — CL (ref 13.0–17.0)
Immature Granulocytes: 8 %
Lymphocytes Relative: 40 %
Lymphs Abs: 1.4 10*3/uL (ref 0.7–4.0)
MCH: 30.1 pg (ref 26.0–34.0)
MCHC: 31.8 g/dL (ref 30.0–36.0)
MCV: 94.6 fL (ref 80.0–100.0)
Monocytes Absolute: 0.5 10*3/uL (ref 0.1–1.0)
Monocytes Relative: 16 %
Neutro Abs: 1.2 10*3/uL — ABNORMAL LOW (ref 1.7–7.7)
Neutrophils Relative %: 35 %
Platelets: 8 10*3/uL — CL (ref 150–400)
RBC: 1.86 MIL/uL — ABNORMAL LOW (ref 4.22–5.81)
RDW: 16.4 % — ABNORMAL HIGH (ref 11.5–15.5)
WBC: 3.4 10*3/uL — ABNORMAL LOW (ref 4.0–10.5)
nRBC: 11.4 % — ABNORMAL HIGH (ref 0.0–0.2)

## 2020-10-08 LAB — COMPREHENSIVE METABOLIC PANEL
ALT: 19 U/L (ref 0–44)
AST: 58 U/L — ABNORMAL HIGH (ref 15–41)
Albumin: 2 g/dL — ABNORMAL LOW (ref 3.5–5.0)
Alkaline Phosphatase: 214 U/L — ABNORMAL HIGH (ref 38–126)
Anion gap: 15 (ref 5–15)
BUN: 27 mg/dL — ABNORMAL HIGH (ref 8–23)
CO2: 16 mmol/L — ABNORMAL LOW (ref 22–32)
Calcium: 8 mg/dL — ABNORMAL LOW (ref 8.9–10.3)
Chloride: 105 mmol/L (ref 98–111)
Creatinine, Ser: 1.14 mg/dL (ref 0.61–1.24)
GFR, Estimated: >60 mL/min (ref >60)
Glucose, Bld: 103 mg/dL — ABNORMAL HIGH (ref 70–99)
Potassium: 4.2 mmol/L (ref 3.5–5.1)
Sodium: 136 mmol/L (ref 135–145)
Total Bilirubin: 2 mg/dL — ABNORMAL HIGH (ref 0.3–1.2)
Total Protein: 4.5 g/dL — ABNORMAL LOW (ref 6.5–8.1)

## 2020-10-08 LAB — APTT: aPTT: 37 seconds — ABNORMAL HIGH (ref 24–36)

## 2020-10-08 LAB — LACTIC ACID, PLASMA: Lactic Acid, Venous: 8.1 mmol/L (ref 0.5–1.9)

## 2020-10-08 LAB — PROTIME-INR
INR: 1.6 — ABNORMAL HIGH (ref 0.8–1.2)
Prothrombin Time: 18.4 seconds — ABNORMAL HIGH (ref 11.4–15.2)

## 2020-10-08 LAB — PREPARE RBC (CROSSMATCH)

## 2020-10-08 MED ORDER — DOCUSATE SODIUM 100 MG PO CAPS
100.0000 mg | ORAL_CAPSULE | Freq: Two times a day (BID) | ORAL | Status: DC | PRN
Start: 2020-10-08 — End: 2020-10-14

## 2020-10-08 MED ORDER — LACTATED RINGERS IV BOLUS
1000.0000 mL | Freq: Once | INTRAVENOUS | Status: AC
  Administered 2020-10-08: 1000 mL via INTRAVENOUS

## 2020-10-08 MED ORDER — IOHEXOL 300 MG/ML  SOLN
100.0000 mL | Freq: Once | INTRAMUSCULAR | Status: AC | PRN
  Administered 2020-10-08: 100 mL via INTRAVENOUS

## 2020-10-08 MED ORDER — SODIUM CHLORIDE 0.9 % IV SOLN
10.0000 mL/h | Freq: Once | INTRAVENOUS | Status: AC
  Administered 2020-10-08: 10 mL/h via INTRAVENOUS

## 2020-10-08 MED ORDER — FENTANYL CITRATE (PF) 100 MCG/2ML IJ SOLN
INTRAMUSCULAR | Status: AC
  Filled 2020-10-08: qty 2

## 2020-10-08 MED ORDER — OXYCODONE HCL 5 MG PO TABS
5.0000 mg | ORAL_TABLET | Freq: Three times a day (TID) | ORAL | Status: DC | PRN
  Administered 2020-10-09: 5 mg via ORAL
  Filled 2020-10-08: qty 1

## 2020-10-08 MED ORDER — POLYETHYLENE GLYCOL 3350 17 G PO PACK: 17.0000 g | PACK | Freq: Every day | ORAL | Status: DC | PRN

## 2020-10-08 MED ORDER — FENTANYL CITRATE (PF) 100 MCG/2ML IJ SOLN
25.0000 ug | Freq: Once | INTRAMUSCULAR | Status: AC
  Administered 2020-10-08: 25 ug via INTRAVENOUS

## 2020-10-08 NOTE — ED Notes (Signed)
CCM at bedside 

## 2020-10-08 NOTE — ED Provider Notes (Signed)
I saw and evaluated the patient, reviewed the resident's note and I agree with the findings and plan.  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  Was in the emergency department most recently on January 11 for hypotension and anemia.  Was transfused and discharged home.  Presents again today with increasing hypoxia hypotension requiring a nonrebreather prehospital, abdominal distention has been worse according to family.  Pertinent Exam findings: On exam this patient has a distended nontender abdomen.  He has equal lung sounds bilaterally with some rhonchi and rales, he is able to speak but in shortened sentences.  He has scant edema at the ankles, symmetrically.  Cardiac exam consistent with mild tachycardia.  The patient's mucous membranes are all extremely pale including his conjunctive and oral mucosa  I suspect this patient has recurrent severe anemia, also may have a respiratory process given his hypoxia.  He is on a high flow nonrebreather.  Labs and x-rays will be ordered  I was personally present and directly supervised and participated with (chest tube) the following procedures:  Medical evaluation Chest tube placement. Critical care Stabilizing care for hypoxic respiratory failure  CRITICAL CARE Performed by: Johnna Acosta Total critical care time: 35 minutes Critical care time was exclusive of separately billable procedures and treating other patients. Critical care was necessary to treat or prevent imminent or life-threatening deterioration. Critical care was time spent personally by me on the following activities: development of treatment plan with patient and/or surrogate as well as nursing, discussions with consultants, evaluation of patient's response to treatment, examination of patient,  obtaining history from patient or surrogate, ordering and performing treatments and interventions, ordering and review of laboratory studies, ordering and review of radiographic studies, pulse oximetry and re-evaluation of patient's condition.    I personally interpreted the EKG as well as the resident and agree with the interpretation on the resident's chart.  Final diagnoses:  Pneumothorax  Anemia, unspecified type  Prostate cancer [facility])      Noemi Chapel, MD 10/10/20 [phone]

## 2020-10-08 NOTE — ED Provider Notes (Signed)
EKG performed on 10/08/2020 at 5:55 PM, sinus rhythm, low voltage diffusely, poor R wave progression, no abnormal T waves or ST elevation to suggest ischemia.  Abnormal EKG   Noemi Chapel, MD 10/08/20 [phone]

## 2020-10-08 NOTE — ED Provider Notes (Signed)
[place] EMERGENCY DEPARTMENT Provider Note   CSN: [id number] Arrival date & time: 10/08/20  1742     History Chief Complaint  Patient presents with  . Hypotension    Danny Mccall is a 75 y.o. male.  The history is provided by the patient.  Weakness Severity:  Moderate Onset quality:  Gradual Duration:  3 days Timing:  Constant Progression:  Worsening Chronicity:  Chronic Context: dehydration   Context comment:  Metastatic cancer Relieved by:  Nothing Worsened by:  Standing Ineffective treatments:  None tried Associated symptoms: nausea        Past Medical History:  Diagnosis Date  . Anemia   . Diabetes mellitus without complication ([place])    [id number] dont have it any longer. " Last A1C was 6.2"  . Duodenal ulcer 10/2017  . History of blood transfusion    numerous at a [facility] facility  . Hypertension   . Open-angle glaucoma    bilateral  . Panuveitis, both eyes   . Peripheral focal chorioretinal inflammation    both eyes  . Prostate cancer ([place])    Gleason 9, Bone Metastasis    Patient Active Problem List   Diagnosis Date Noted  . Shock ([place]) 10/08/2020  . Melena 11/07/2017  . H. pylori duodenitis 11/07/2017  . Prostate cancer ([place]) 11/07/2017  . BPH (benign prostatic hyperplasia) 11/07/2017  . HLD (hyperlipidemia) 11/07/2017  . Heme positive stool   . Duodenum ulcer   . Anemia 11/04/2017  . HTN (hypertension) 07/30/2013  . Diabetes ([place]) 07/30/2013    Past Surgical History:  Procedure Laterality Date  . ABDOMINAL SURGERY    . CATARACT EXTRACTION W/ INTRAOCULAR LENS  IMPLANT, BILATERAL    . CHOLECYSTECTOMY    . ESOPHAGOGASTRODUODENOSCOPY N/A 11/05/2017   Procedure: ESOPHAGOGASTRODUODENOSCOPY (EGD);  Surgeon: Jerene Bears, MD;  Location: [facility] ENDOSCOPY;  Service: Gastroenterology;  Laterality: N/A;  . EYE SURGERY Right    , cataract, surgeries for Glaucoma  . RADIOLOGY WITH ANESTHESIA N/A 08/10/2020   Procedure:  MRI WITH ANESTHESIA OF BRAIN WITH AND WITHOUT CONTRAST AND MRI OF ORBITS WITH AND WITHOUT CONTRAST;  Surgeon: Radiologist, Medication, MD;  Location: [facility];  Service: Radiology;  Laterality: N/A;       Family History  Problem Relation Age of Onset  . Diabetes Mother   . Cancer Father        throat  . Colon cancer Other        sister's child    Social History   Tobacco Use  . Smoking status: Former Smoker    Packs/day: 0.50    Years: 20.00    Pack years: 10.00    Types: Cigarettes    Quit date: 11/09/2016    Years since quitting: 3.9  . Smokeless tobacco: Never Used  . Tobacco comment: "smoked off and on"  Vaping Use  . Vaping Use: Never used  Substance Use Topics  . Alcohol use: Not Currently  . Drug use: No    Home Medications Prior to Admission medications   Medication Sig Start Date End Date Taking? Authorizing Provider  diclofenac Sodium (VOLTAREN) 1 % GEL Apply 2 g topically daily as needed (pain).    [provider]  docusate sodium (COLACE) 100 MG capsule Take 100 mg by mouth daily.    [provider]  ipratropium (ATROVENT) 0.03 % nasal spray Place 2 sprays into both nostrils every 12 (twelve) hours.    [provider]  latanoprost Ivin Poot)  0.005 % ophthalmic solution Place 1 drop into both eyes at bedtime.    [provider]  loratadine (CLARITIN) 10 MG tablet Take 10 mg by mouth daily.    [provider]  LOTEMAX SM 0.38 % GEL Place 1 drop into both eyes 4 (four) times daily. 06/08/20   [provider]  oxyCODONE (OXY IR/ROXICODONE) 5 MG immediate release tablet Take 5 mg by mouth 2 (two) times daily as needed for moderate pain or severe pain.     [provider]  polyethylene glycol (MIRALAX) 17 g packet Take 17 g by mouth daily as needed (constipation). Patient taking differently: Take 17 g by mouth daily as needed for mild constipation (constipation). 05/05/19   McDonald, Mia A, PA-C  SIMBRINZA 1-0.2  % SUSP Place 1 drop into both eyes in the morning, at noon, and at bedtime.  08/24/17   [provider]    Allergies    Penicillins  Review of Systems   Review of Systems  Gastrointestinal: Positive for nausea.  Neurological: Positive for weakness.  All other systems reviewed and are negative.   Physical Exam Updated Vital Signs BP 99/67   Pulse 93   Temp (!) 97.2 F (36.2 C) (Temporal)   Resp 16   SpO2 100%   Physical Exam Vitals and nursing note reviewed.  Constitutional:      Appearance: He is well-developed and well-nourished. He is cachectic. He is ill-appearing.  HENT:     Head: Normocephalic and atraumatic.  Eyes:     Conjunctiva/sclera: Conjunctivae normal.  Cardiovascular:     Rate and Rhythm: Normal rate and regular rhythm.     Heart sounds: No murmur heard.   Pulmonary:     Effort: Pulmonary effort is normal. No respiratory distress.     Breath sounds: Normal breath sounds.  Abdominal:     Palpations: Abdomen is soft.     Tenderness: There is no abdominal tenderness.     Comments: Midline abdominal incision- well healed  Musculoskeletal:        General: No edema.     Cervical back: Neck supple.  Skin:    General: Skin is warm and dry.  Neurological:     Mental Status: He is alert.  Psychiatric:        Mood and Affect: Mood and affect normal.     ED Results / Procedures / Treatments   Labs (all labs ordered are listed, but only abnormal results are displayed) Labs Reviewed  LACTIC ACID, PLASMA - Abnormal; Notable for the following components:      Result Value   Lactic Acid, Venous 8.1 (*)    All other components within normal limits  COMPREHENSIVE METABOLIC PANEL - Abnormal; Notable for the following components:   CO2 16 (*)    Glucose, Bld 103 (*)    BUN 27 (*)    Calcium 8.0 (*)    Total Protein 4.5 (*)    Albumin 2.0 (*)    AST 58 (*)    Alkaline Phosphatase 214 (*)    Total Bilirubin 2.0 (*)    All other components within  normal limits  CBC WITH DIFFERENTIAL/PLATELET - Abnormal; Notable for the following components:   WBC 3.4 (*)    RBC 1.86 (*)    Hemoglobin 5.6 (*)    HCT 17.6 (*)    RDW 16.4 (*)    Platelets 8 (*)    nRBC 11.4 (*)    Neutro Abs 1.2 (*)  Abs Immature Granulocytes 0.28 (*)    All other components within normal limits  PROTIME-INR - Abnormal; Notable for the following components:   Prothrombin Time 18.4 (*)    INR 1.6 (*)    All other components within normal limits  APTT - Abnormal; Notable for the following components:   aPTT 37 (*)    All other components within normal limits  CULTURE, BLOOD (SINGLE)  URINE CULTURE  SARS CORONAVIRUS 2 (TAT 6-24 HRS)  CULTURE, BLOOD (ROUTINE X 2)  CULTURE, BLOOD (ROUTINE X 2)  LACTIC ACID, PLASMA  URINALYSIS, ROUTINE W REFLEX MICROSCOPIC  PATHOLOGIST SMEAR REVIEW  CBC  CBC  URINALYSIS, ROUTINE W REFLEX MICROSCOPIC  BASIC METABOLIC PANEL  MAGNESIUM  PHOSPHORUS  FIBRINOGEN  CBC  CBC  TYPE AND SCREEN  PREPARE RBC (CROSSMATCH)  PREPARE PLATELET PHERESIS    EKG EKG Interpretation  Date/Time:  Friday October 08 2020 17:55:36 EST Ventricular Rate:  100 PR Interval:    QRS Duration: 75 QT Interval:  443 QTC Calculation: 572 R Axis:   94 Text Interpretation: duplicate, discard Confirmed by Delora Fuel ([id number]) on 10/08/2020 11:54:34 PM   Radiology DG Chest 1 View  Result Date: 10/08/2020 CLINICAL DATA:  Sepsis.  Prostate cancer. EXAM: CHEST  1 VIEW COMPARISON:  07/04/1999 FINDINGS: Heart size and vascularity normal.  Left lung clear Right lower lobe atelectasis/infiltrate. Probable right-sided pneumothorax. Skin fold considered less likely. Correlate with symptoms. Follow-up chest x-ray could confirm. Small right effusion Sclerotic skeletal lesions compatible with diffuse bony metastatic disease. IMPRESSION: Probable right pneumothorax and right lower lobe atelectasis. Small right effusion. Recommend follow-up chest x-ray to confirm.  Diffuse bony metastasis. Electronically Signed   By: Franchot Gallo M.D.   On: 10/08/2020 18:40   CT CHEST ABDOMEN PELVIS W CONTRAST  Result Date: 10/08/2020 CLINICAL DATA:  History of prostate carcinoma and recent chest tube placement EXAM: CT CHEST, ABDOMEN, AND PELVIS WITH CONTRAST TECHNIQUE: Multidetector CT imaging of the chest, abdomen and pelvis was performed following the standard protocol during bolus administration of intravenous contrast. CONTRAST:  149mL OMNIPAQUE IOHEXOL 300 MG/ML  SOLN COMPARISON:  Chest x-rays from earlier in the same day. FINDINGS: CT CHEST FINDINGS Cardiovascular: Thoracic aorta and its branches are within normal limits. Mild atherosclerotic calcifications are seen. No aneurysmal dilatation or dissection is seen. Heart size is within normal limits. Pulmonary artery shows no large central pulmonary embolus. Mediastinum/Nodes: Thoracic inlet is within normal limits. No sizable hilar or mediastinal adenopathy is noted. The esophagus as visualized is within normal limits. Lungs/Pleura: New chest tube is been placed on the right and lies within the major fissure and extends to the right cardiac border. Bilateral pleural effusions are noted large on the right and small on the left. The pleural fluid on the right is not being adequately presented to the chest tube. Small anteromedial pneumothorax is noted on the right. Right lower lobe and middle lobe atelectatic changes are seen. Patchy ground-glass changes are noted in the right lower lobe. Correlate with COVID-19 testing which is pending. Left lung demonstrates some lower lobe atelectatic changes as well. Musculoskeletal: Diffuse bony metastatic disease is noted. No compression deformity or acute rib abnormality is seen. These changes are stable from previous exams. CT ABDOMEN PELVIS FINDINGS Hepatobiliary: Innumerable hepatic metastatic lesions are noted which have increased in both size and number when compared with the prior  exam. Gallbladder has been surgically removed. Pancreas: Unremarkable. No pancreatic ductal dilatation or surrounding inflammatory changes. Spleen: Mottled density within  the spleen is noted suspicious for metastatic disease as well. Adrenals/Urinary Tract: Adrenal glands are within normal limits. Kidneys demonstrate a normal enhancement pattern. No renal calculi or obstructive changes are noted. Bladder is well distended. Stomach/Bowel: Appendix is not well visualized. No obstructive or inflammatory changes of the colon are seen. Diverticular change of the colon is noted without diverticulitis. The stomach and small bowel appear within normal limits. Vascular/Lymphatic: Aortic atherosclerosis. No enlarged abdominal or pelvic lymph nodes. Reproductive: Prostate demonstrates planning markers within. Other: Ascites is seen increased when compared with the prior exam. Mild changes of anasarca are noted as well. Musculoskeletal: Diffuse sclerotic bony metastatic disease is noted without evidence of compression deformity. IMPRESSION: CT of the chest: Bilateral pleural effusions right considerably greater than left. The indwelling chest tube on the right is not within the pleural effusion and may not affect adequate drainage. Small right anteromedial pneumothorax. Given the tube position within the major fissure this may also not resolve with the current tube drainage. Diffuse bony metastatic disease. CT of the abdomen and pelvis: Hepatic metastatic disease which is increased in both size and number when compared with the prior study. Diverticulosis without diverticulitis. New ascites and mild changes of anasarca. Diffuse sclerotic bony metastatic disease consistent with the given clinical history. Electronically Signed   By: Inez Catalina M.D.   On: 10/08/2020 23:48   DG Chest Port 1 View  Result Date: 10/08/2020 CLINICAL DATA:  Chest tube placement EXAM: PORTABLE CHEST 1 VIEW COMPARISON:  10/08/2020, CT 07/05/2019  FINDINGS: Interval placement of right-sided chest tube with pigtail overlying the spine. Asymmetric hazy opacity in the right thorax could be secondary to pleural effusion or asymmetric edema. No definitive pneumothorax. Diffuse skeletal sclerosis consistent with metastatic disease. IMPRESSION: Interval placement of right-sided chest tube with asymmetric hazy opacity of the right thorax, question pleural effusion or asymmetric edema. No pneumothorax. Electronically Signed   By: Donavan Foil M.D.   On: 10/08/2020 22:44   DG Abd 2 Views  Result Date: 10/08/2020 CLINICAL DATA:  Sepsis EXAM: ABDOMEN - 2 VIEW COMPARISON:  CT abdomen pelvis 08/28/2020 FINDINGS: Normal bowel gas pattern. No obstruction or ileus. No free air on upright view. Widespread bony metastatic disease. IMPRESSION: Normal bowel gas pattern Diffuse skeletal metastasis. Electronically Signed   By: Franchot Gallo M.D.   On: 10/08/2020 18:41    Procedures CHEST TUBE INSERTION  Date/Time: 10/08/2020 11:44 PM Performed by: Camila Li, MD Authorized by: Noemi Chapel, MD   Consent:    Consent obtained:  Written   Consent given by:  Patient and spouse   Risks, benefits, and alternatives were discussed: yes     Risks discussed:  Bleeding, damage to surrounding structures, incomplete drainage, infection, nerve damage and pain   Alternatives discussed:  No treatment, delayed treatment, observation and alternative treatment Universal protocol:    Procedure explained and questions answered to patient or proxy's satisfaction: yes     Relevant documents present and verified: yes     Test results available: yes     Imaging studies available: yes     Site/side marked: yes     Immediately prior to procedure, a time out was called: yes     Patient identity confirmed:  Arm band Pre-procedure details:    Skin preparation:  Chlorhexidine   Preparation: Patient was prepped and draped in the usual sterile fashion   Sedation:    Sedation  type:  None Anesthesia:    Anesthesia method:  Local infiltration  Local anesthetic:  Lidocaine 1% w/o epi Procedure details:    Placement location:  R lateral   Tube size (Fr):  16   Ultrasound guidance: no     Tension pneumothorax: no     Tube connected to:  Suction   Drainage characteristics:  Air only   Suture material: 3-0 silk.   Dressing:  Petrolatum-impregnated gauze Post-procedure details:    Post-insertion x-ray findings: tube in good position     Procedure completion:  Tolerated     Medications Ordered in ED Medications  docusate sodium (COLACE) capsule 100 mg (has no administration in time range)  polyethylene glycol (MIRALAX / GLYCOLAX) packet 17 g (has no administration in time range)  oxyCODONE (Oxy IR/ROXICODONE) immediate release tablet 5 mg (has no administration in time range)  lactated ringers bolus 1,000 mL (0 mLs Intravenous Stopped 10/08/20 2024)  lactated ringers bolus 1,000 mL (0 mLs Intravenous Stopped 10/08/20 2240)  0.9 %  sodium chloride infusion (10 mL/hr Intravenous New Bag/Given 10/08/20 2117)  0.9 %  sodium chloride infusion (0 mL/hr Intravenous Stopped 10/08/20 2117)  fentaNYL (SUBLIMAZE) injection 25 mcg (25 mcg Intravenous Given 10/08/20 2037)  iohexol (OMNIPAQUE) 300 MG/ML solution 100 mL (100 mLs Intravenous Contrast Given 10/08/20 2329)    ED Course  I have reviewed the triage vital signs and the nursing notes.  Pertinent labs & imaging results that were available during my care of the patient were reviewed by me and considered in my medical decision making (see chart for details).    MDM Rules/Calculators/A&P                          This is a 76 year old male with a past medical history of anemia/pancytopenia, stage IV widely metastatic prostate cancer currently under palliative care who presents emergency department for worsening weakness. Per EMS, report from family that the patient has had generalized weakness, inability to get around at  home and today was unable to have the strength to get himself off the toilet. He has not had any recent falls, head injuries, fevers, diarrhea, or vomiting.  Patient is involved with palliative care per our EMR, but CODE STATUS is unable to be confirmed, the most recent status we have is full code, attempted to contact his wife but she was unable to answer the phone.  His work-up in the emergency department showed significant anemia to a hemoglobin of 5.5 with associated platelets of 8, which appear to be down from a previous of 6 2 weeks ago in which she reportedly was transfused 3 days ago for and platelets that were at 15 2 weeks ago.  With the decreased p.o. intake and an elevated BUN to 27 as well as a small bump in creatinine to 1.14, it appears that he likely has twofold commendation to causes hypotension with severe anemia as well as dehydration.  He was fluid resuscitated with intravenous fluids and was crossed and ordered to receive 2 units of packed red blood cells as well as a unit of platelets.  Chest x-ray did reveal a spontaneous right-sided pneumothorax, the patient's mentation is at baseline, his blood pressures are low normotensive but likely secondary to his anemia and dehydration as his normal systolics are in the low 952W.  He does not have any evidence of mediastinal shift to concern for tension physiology at this time.  I did reach out and speak with the on-call hematology-oncology attending regarding platelet counts and this  gentleman with active cancer.  Recommendation for any invasive procedure was to give 1 unit of platelets and wait approximately 1 hour to do the pigtail thoracostomy.  Following platelet transfusion, a right-sided pigtail thoracostomy tube was placed successfully with reexpansion of the lung, of note the post chest tube placement x-ray was taken in the supine that showed some layering of likely pleural versus bloody effusion.  Patient had significant lactic  acidosis to 8 which is likely secondary to his anemia and poor perfusion/oxygenation of peripheral tissues, after fluid resuscitation repeat lactic acid is pending.  He has no significant elevation in white count from his baseline leukopenia, has not had any fevers, he does not have any abdominal pain, cough, diarrhea, urinary symptoms, headaches to concern me for infectious process at this time.  We will hold off on antibiotics, further resuscitate with blood and after talking with the intensivist, will plan to admit to the ICU with a repeat CT chest abdomen pelvis for further management of his known metastatic disease and to evaluate further the likely cause of his spontaneous pneumothorax.  Throughout the patient's stay, he remained hemodynamically stable, responded well to fluids and blood with improvement in his blood pressure with systolics from the low 0000000 and upper 80s into the upper 90s.  Further discussion by the intensivist was had at bedside in the emergency department with the patient and the wife, to clarify CODE STATUS, the patient will pursue DNR full, would elect for intubation if respiratory status declined, but would not want CPR.  Final Clinical Impression(s) / ED Diagnoses Final diagnoses:  Pneumothorax  Anemia, unspecified type  Prostate cancer [facility])    Rx / DC Orders ED Discharge Orders    None       Camila Li, MD 10/08/20 2359    Noemi Chapel, MD 10/10/20 1450

## 2020-10-08 NOTE — Progress Notes (Signed)
Received report from [place], ED RN. Once Covid test comes back and CT gets done, per CCM, patient will come to room 2M03.

## 2020-10-08 NOTE — ED Notes (Signed)
Pt has coughed up bright red blood x3; Dr.Izquieredo made aware

## 2020-10-08 NOTE — ED Triage Notes (Signed)
Pt presents to ED NIB [facility]. Pt c/o increased weakness. EMS found pt to be hypotensi e and hypoxic. Pt initially bp 70/40 and 84% on RA. EMS given 1L IVF and placed pt on NRB. Family noted pt's abd to be more distended. Pt is bone cancer pt.

## 2020-10-08 NOTE — H&P (Addendum)
NAME:  Danny Mccall, MRN:  [id number], DOB:  17-Feb-1946, LOS: 0 ADMISSION DATE:  10/08/2020, CONSULTATION DATE: 10-08-20 REFERRING MD: Noemi Chapel, CHIEF COMPLAINT: Weakness and lethargy  Brief History:  Is a 75 year old with history of metastatic prostate cancer that presented with weakness and lethargy.  Patient noted to be anemic and hypotensive with lactic acidosis and pneumothorax.  History of Present Illness:  This is a 75 year old with history notable for prostate cancer that is metastatic to the right femur and liver.  No other significant medical history outside of diabetes mellitus that has been controlled with lifestyle modifications.  Patient not on any current medications for his diabetes.  A1c acceptable per PCPs note.  Most notably patient has received palliative radiation for brain metastasis.  Patient also has metastasis to the liver and bone.  Right femur being the most notable.  Patient receives care at [facility].  There he has been receiving palliative radiation with rad Onc last seen on 08/11/2020.  Is now noted per their documentation to be transfusion dependent due to progressive cancer.  Patient has been receiving multiple transfusions for the last 8 months.  Last received transfusion on 10/05/2020 at [facility].. Most recent CBC on 09/29/2020 notes hemoglobin to 7.5 previous to that on 09/01/2020 hemoglobin was 6.4.  Wife called EMS this evening because patient was having issues getting to the bathroom.  When patient got to the bathroom he became diaphoretic and stated that he was very hot.  On arrival to the emergency department patient noted to have anemia with hemoglobin below 6.  Also hypotensive with systolics in the [id number] maps in the mid 60s.  Patient usually has systolics in the [id number] and diastolics in the [id number] per wife.  Also of note patient noted to have a chest x-ray that was concerning for pneumothorax on the right.  Patient with mild AKI baseline creatinine is around 0.76  creatinine today at 1.14.  Patient also with low bicarb of 16 and lactic acid of 8.  Additionally platelets to be low at 8 as well.  Patient was typed and crossed for blood received platelets and 2 units of blood in the ED.  Patient also to have chest tube placed in the ED for pneumothorax as well.      Past Medical History:   Metastatic prostate cancer.  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 rate 18, SpO2 100 %.        Intake/Output Summary (Last 24 hours) at 10/08/2020 2057 Last data filed at 10/08/2020 2030 Gross per 24 hour  Intake 2304 ml  Output -  Net 2304 ml   There were no vitals filed for this visit.  Examination: General: Patient in no acute distress resting comfortably with eyes closed will spontaneously open eyes and hold conversation no confusion noted HENT: Clear conjunctiva Lungs: No crackles noted some mildly diminished breath sounds on  the right Cardiovascular: Minimal edema regular rate and rhythm in the room Abdomen: Soft palpable liver in the right upper quadrant.  Nondistended Extremities: No gross deformities noted Neuro: Grossly intact without obvious deficits GU: Deferred  Resolved Hospital Problem list   Not applicable  Assessment & Plan:  This is a 75 year old with advanced metastatic  prostate cancer that is on palliative chemotherapy for brain metastasis.  Patient also transfusion dependent likely from bone marrow suppression.   Shock-patient with prominent lactic acidosis on arrival.  Also hypotensive with maps in the mid 60s.  Likely secondary to hypovolemia exacerbated by the fact that patient with hemoglobin of 5.6.  This is dropped from 7.5 just 3 days ago.  No overt signs of hemorrhage.  Does not seem to be tension pneumothorax no evidence of mediastinal shift on chest x-ray -Obtain CT chest abdomen pelvis.  Will hold on contrast given AKI.  Concerned there could be spontaneous bleed given low platelets.   -Agree with transfusion and platelets -Agree with placement of chest tube -Obtain blood cultures -Obtain UA and urine cultures -Hold antibiotics for now as patient does not appear overtly infected -Low-dose peripheral pressor if needed. -Would ensure patient adequately fluid resuscitated as well as poor p.o. intake is likely concomitantly involved -We will send DIC panel although this is unlikely.  INR and PT reassuring  Pneumothorax-most likely culprit would be metastasis of known prostate cancer.  Patient did fall about a month ago.  -CT chest abdomen and pelvis to further evaluate for metastases as think that this will help with goals of care discussion -Monitor chest tube once it has been placed.  Placed in -20 of suction initially followed by waterseal if patient remains stable  Metastatic prostate cancer-patient on palliative radiation for brain metastasis.  Last received this in December 2021.  Patient transfusion dependent from this as well. -Not receiving chemotherapy is not candidate for this at this time -Patient needs to have further discussions with team and palliative care.  Patient seems to be understanding of poor prognosis and understands that CPR would be of little benefit.  Anemia-transfusion dependent's for 8 months at this point -Transfuse for  hemoglobin of 7 -Does not appear to have GI bleed nor intravascular hemolysis   Lactic acidosis-likely secondary to anemia -Agree with transfusions -Transfusion goal of 7 -Repeat hemoglobin when transfusion complete  AKI-likely secondary to anemia and hypovolemia in general from poor p.o. intake -Patient receiving blood and fluids in the ED -Reassess renal function in morning with BMP and electrolytes  Hemoptysis-submassive and size of quarter. AHppened while in ED.-CT CAP with some GGO in RLL could be pna. More l;ikely related to thormbocytopenia -Levaquin as patient with allergy to penicillins for CAP       Best practice (evaluated daily)  Diet: Full diet for now Pain/Anxiety/Delirium protocol (if indicated): Home Roxicodone placed VAP protocol (if indicated): Not applicable DVT prophylaxis: Not applicable GI prophylaxis: Not needed at this time Glucose control: Follow  BMPs.  Mobility: Bedrest for now Disposition: ICU  Goals of Care:  Last date of multidisciplinary goals of care discussion: Spoke with wife at bedside who is intimately involved in the patient's care.  Attempted to relay poor prognosis.  They are aware.  They would still like to pursue chest tube.  Did relay that putting patient through code situation would not in any way change his general prognosis which is poor to extremely poor.  Given this they would like to forego that if the situation  should arise when attempting to address intubation patient disengaged and would not like to pursue further discussion at this time.  However it does seem that patient aware that prognosis is poor and that advanced therapies would likely be futile including intubation and code situation.  Think that patient and wife are coming to grips with this.  However they need time.  Would likely benefit from palliative consult in the morning Family and staff present: Not applicable Summary of discussion: DNR limited Follow up goals of  care discussion due: Attempt to address intubation.  And place palliative care consult Code Status: DNR limited  Labs   CBC: Recent Labs  Lab 10/08/20 1817  WBC 3.4*  NEUTROABS 1.2*  HGB 5.6*  HCT 17.6*  MCV 94.6  PLT 8*    Basic Metabolic Panel: Recent Labs  Lab 10/08/20 1817  NA 136  K 4.2  CL 105  CO2 16*  GLUCOSE 103*  BUN 27*  CREATININE 1.14  CALCIUM 8.0*   GFR: CrCl cannot be calculated (Unknown ideal weight.). Recent Labs  Lab 10/08/20 1817  WBC 3.4*  LATICACIDVEN 8.1*    Liver Function Tests: Recent Labs  Lab 10/08/20 1817  AST 58*  ALT 19  ALKPHOS 214*  BILITOT 2.0*  PROT 4.5*  ALBUMIN 2.0*   No results for input(s): LIPASE, AMYLASE in the last 168 hours. No results for input(s): AMMONIA in the last 168 hours.  ABG    Component Value Date/Time   TCO2 28 03/13/2012 1924     Coagulation Profile: Recent Labs  Lab 10/08/20 1817  INR 1.6*    Cardiac Enzymes: No results for input(s): CKTOTAL, CKMB, CKMBINDEX, TROPONINI in the last 168 hours.  HbA1C: Hgb A1c MFr Bld  Date/Time Value Ref Range Status  07/30/2013 01:30 PM 6.7 (H) <5.7 % Final    Comment:    (NOTE)                                                                       According to the ADA Clinical Practice Recommendations for 2011, when HbA1c is used as a screening test:  >=6.5%   Diagnostic of Diabetes Mellitus           (if abnormal result is confirmed) 5.7-6.4%   Increased risk of developing Diabetes Mellitus References:Diagnosis and Classification of Diabetes Mellitus,Diabetes CZYS,0630,16(WFUXN 1):S62-S69 and Standards of Medical Care in         Diabetes - 2011,Diabetes ATFT,7322,02 (Suppl 1):S11-S61.    CBG: No results for input(s): GLUCAP in the last 168 hours.  Review of Systems:   Pertinent positives and negatives per HPI 12 point review of systems was performed and unless otherwise stated was negative  Past Medical History:  He,  has a past medical  history of Anemia, Diabetes mellitus without complication ([place]), Duodenal ulcer (10/2017), History of blood transfusion, Hypertension, Open-angle glaucoma, Panuveitis, both eyes, Peripheral focal chorioretinal inflammation, and Prostate cancer ([place]).   Surgical History:   Past Surgical History:  Procedure Laterality Date  . ABDOMINAL SURGERY    . CATARACT EXTRACTION W/ INTRAOCULAR LENS  IMPLANT, BILATERAL    . CHOLECYSTECTOMY    . ESOPHAGOGASTRODUODENOSCOPY N/A 11/05/2017   Procedure: ESOPHAGOGASTRODUODENOSCOPY (EGD);  Surgeon: Jerene Bears, MD;  Location: [facility];  Service: Gastroenterology;  Laterality: N/A;  . EYE SURGERY Right    , cataract, surgeries for Glaucoma  . RADIOLOGY WITH ANESTHESIA N/A 08/10/2020   Procedure: MRI WITH ANESTHESIA OF BRAIN WITH AND WITHOUT CONTRAST AND MRI OF ORBITS WITH AND WITHOUT CONTRAST;  Surgeon: Radiologist, Medication, MD;  Location: [facility];  Service: Radiology;  Laterality: N/A;     Social History:   reports that he quit smoking about 3 years ago. His smoking use included cigarettes. He has a 10.00 pack-year smoking history. He has never used smokeless tobacco. He reports previous alcohol use. He reports that he does not use drugs.   Family History:  His family history includes Cancer in his father; Colon cancer in an other family member; Diabetes in his mother.   Allergies Allergies  Allergen Reactions  . Penicillins Hives and Rash     Home Medications  Prior to Admission medications   Medication Sig Start Date End Date Taking? Authorizing Provider  diclofenac Sodium (VOLTAREN) 1 % GEL Apply 2 g topically daily as needed (pain).    [provider]  docusate sodium (COLACE) 100 MG capsule Take 100 mg by mouth daily.    [provider]  ipratropium (ATROVENT) 0.03 % nasal spray Place 2 sprays into both nostrils every 12 (twelve) hours.    [provider]  latanoprost (XALATAN) 0.005 % ophthalmic solution Place 1  drop into both eyes at bedtime.    [provider]  loratadine (CLARITIN) 10 MG tablet Take 10 mg by mouth daily.    [provider]  LOTEMAX SM 0.38 % GEL Place 1 drop into both eyes 4 (four) times daily. 06/08/20   [provider]  oxyCODONE (OXY IR/ROXICODONE) 5 MG immediate release tablet Take 5 mg by mouth 2 (two) times daily as needed for moderate pain or severe pain.     [provider]  polyethylene glycol (MIRALAX) 17 g packet Take 17 g by mouth daily as needed (constipation). Patient taking differently: Take 17 g by mouth daily as needed for mild constipation (constipation). 05/05/19   McDonald, Mia A, PA-C  SIMBRINZA 1-0.2 % SUSP Place 1 drop into both eyes in the morning, at noon, and at bedtime.  08/24/17   [provider]     Critical care time: 10/08/20-60 minutes

## 2020-10-09 ENCOUNTER — Inpatient Hospital Stay (HOSPITAL_COMMUNITY): Payer: Medicare HMO

## 2020-10-09 ENCOUNTER — Encounter (HOSPITAL_COMMUNITY): Payer: Self-pay | Admitting: Pulmonary Disease

## 2020-10-09 DIAGNOSIS — Z515 Encounter for palliative care: Secondary | ICD-10-CM | POA: Diagnosis not present

## 2020-10-09 DIAGNOSIS — I9589 Other hypotension: Secondary | ICD-10-CM

## 2020-10-09 DIAGNOSIS — C7982 Secondary malignant neoplasm of genital organs: Secondary | ICD-10-CM

## 2020-10-09 DIAGNOSIS — L8990 Pressure ulcer of unspecified site, unspecified stage: Secondary | ICD-10-CM | POA: Insufficient documentation

## 2020-10-09 DIAGNOSIS — Z7189 Other specified counseling: Secondary | ICD-10-CM

## 2020-10-09 DIAGNOSIS — J939 Pneumothorax, unspecified: Secondary | ICD-10-CM | POA: Diagnosis not present

## 2020-10-09 DIAGNOSIS — E861 Hypovolemia: Secondary | ICD-10-CM

## 2020-10-09 LAB — GLUCOSE, CAPILLARY
Glucose-Capillary: 83 mg/dL (ref 70–99)
Glucose-Capillary: 93 mg/dL (ref 70–99)
Glucose-Capillary: 65 mg/dL — ABNORMAL LOW (ref 70–99)
Glucose-Capillary: 63 mg/dL — ABNORMAL LOW (ref 70–99)
Glucose-Capillary: 84 mg/dL (ref 70–99)
Glucose-Capillary: 86 mg/dL (ref 70–99)
Glucose-Capillary: 82 mg/dL (ref 70–99)
Glucose-Capillary: 88 mg/dL (ref 70–99)
Glucose-Capillary: 79 mg/dL (ref 70–99)

## 2020-10-09 LAB — BPAM PLATELET PHERESIS
Blood Product Expiration Date: 202201292359
ISSUE DATE / TIME: 202201282015
Unit Type and Rh: 6200

## 2020-10-09 LAB — CBC
HCT: 25.7 % — ABNORMAL LOW (ref 39.0–52.0)
HCT: 25.4 % — ABNORMAL LOW (ref 39.0–52.0)
Hemoglobin: 8.6 g/dL — ABNORMAL LOW (ref 13.0–17.0)
Hemoglobin: 8.4 g/dL — ABNORMAL LOW (ref 13.0–17.0)
MCH: 30.7 pg (ref 26.0–34.0)
MCH: 30.3 pg (ref 26.0–34.0)
MCHC: 33.5 g/dL (ref 30.0–36.0)
MCHC: 33.1 g/dL (ref 30.0–36.0)
MCV: 91.8 fL (ref 80.0–100.0)
MCV: 91.7 fL (ref 80.0–100.0)
Platelets: 48 10*3/uL — ABNORMAL LOW (ref 150–400)
Platelets: 45 10*3/uL — ABNORMAL LOW (ref 150–400)
RBC: 2.8 MIL/uL — ABNORMAL LOW (ref 4.22–5.81)
RBC: 2.77 MIL/uL — ABNORMAL LOW (ref 4.22–5.81)
RDW: 15.9 % — ABNORMAL HIGH (ref 11.5–15.5)
RDW: 15.9 % — ABNORMAL HIGH (ref 11.5–15.5)
WBC: 2.6 10*3/uL — ABNORMAL LOW (ref 4.0–10.5)
WBC: 2.3 10*3/uL — ABNORMAL LOW (ref 4.0–10.5)
nRBC: 15.2 % — ABNORMAL HIGH (ref 0.0–0.2)
nRBC: 18.3 % — ABNORMAL HIGH (ref 0.0–0.2)

## 2020-10-09 LAB — PREPARE PLATELET PHERESIS: Unit division: 0

## 2020-10-09 LAB — URINALYSIS, ROUTINE W REFLEX MICROSCOPIC
Bilirubin Urine: NEGATIVE
Glucose, UA: NEGATIVE mg/dL
Hgb urine dipstick: NEGATIVE
Ketones, ur: NEGATIVE mg/dL
Leukocytes,Ua: NEGATIVE
Nitrite: NEGATIVE
Protein, ur: NEGATIVE mg/dL
Specific Gravity, Urine: 1.028 (ref 1.005–1.030)
pH: 5 (ref 5.0–8.0)

## 2020-10-09 LAB — SARS CORONAVIRUS 2 (TAT 6-24 HRS): SARS Coronavirus 2: NEGATIVE

## 2020-10-09 LAB — BASIC METABOLIC PANEL
Anion gap: 13 (ref 5–15)
BUN: 27 mg/dL — ABNORMAL HIGH (ref 8–23)
CO2: 20 mmol/L — ABNORMAL LOW (ref 22–32)
Calcium: 7.9 mg/dL — ABNORMAL LOW (ref 8.9–10.3)
Chloride: 103 mmol/L (ref 98–111)
Creatinine, Ser: 1.02 mg/dL (ref 0.61–1.24)
GFR, Estimated: >60 mL/min (ref >60)
Glucose, Bld: 109 mg/dL — ABNORMAL HIGH (ref 70–99)
Potassium: 4.3 mmol/L (ref 3.5–5.1)
Sodium: 136 mmol/L (ref 135–145)

## 2020-10-09 LAB — PHOSPHORUS: Phosphorus: 5 mg/dL — ABNORMAL HIGH (ref 2.5–4.6)

## 2020-10-09 LAB — FIBRINOGEN: Fibrinogen: 261 mg/dL (ref 210–475)

## 2020-10-09 LAB — MAGNESIUM: Magnesium: 1.6 mg/dL — ABNORMAL LOW (ref 1.7–2.4)

## 2020-10-09 LAB — MRSA PCR SCREENING: MRSA by PCR: NEGATIVE

## 2020-10-09 LAB — LACTIC ACID, PLASMA: Lactic Acid, Venous: 1.9 mmol/L (ref 0.5–1.9)

## 2020-10-09 MED ORDER — DEXTROSE-NACL 5-0.9 % IV SOLN: INTRAVENOUS | Status: DC

## 2020-10-09 MED ORDER — BRIMONIDINE TARTRATE 0.2 % OP SOLN
1.0000 [drp] | Freq: Three times a day (TID) | OPHTHALMIC | Status: DC
  Administered 2020-10-09 – 2020-10-13 (×15): 1 [drp] via OPHTHALMIC
  Filled 2020-10-09: qty 5

## 2020-10-09 MED ORDER — DEXTROSE 50 % IV SOLN
12.5000 g | Freq: Once | INTRAVENOUS | Status: AC
  Administered 2020-10-09: 12.5 g via INTRAVENOUS
  Filled 2020-10-09: qty 50

## 2020-10-09 MED ORDER — CHLORHEXIDINE GLUCONATE CLOTH 2 % EX PADS
6.0000 | MEDICATED_PAD | Freq: Every day | CUTANEOUS | Status: DC
  Administered 2020-10-09 – 2020-10-13 (×4): 6 via TOPICAL

## 2020-10-09 MED ORDER — ONDANSETRON HCL 4 MG/2ML IJ SOLN
4.0000 mg | Freq: Four times a day (QID) | INTRAMUSCULAR | Status: DC | PRN
  Administered 2020-10-09 – 2020-10-13 (×2): 4 mg via INTRAVENOUS
  Filled 2020-10-09 (×3): qty 2

## 2020-10-09 MED ORDER — HYDROMORPHONE HCL 1 MG/ML IJ SOLN
0.5000 mg | INTRAMUSCULAR | Status: DC | PRN
  Administered 2020-10-10 – 2020-10-13 (×14): 0.5 mg via INTRAVENOUS
  Filled 2020-10-09 (×15): qty 0.5

## 2020-10-09 MED ORDER — PROMETHAZINE HCL 25 MG/ML IJ SOLN
12.5000 mg | Freq: Four times a day (QID) | INTRAMUSCULAR | Status: DC | PRN
  Administered 2020-10-09: 12.5 mg via INTRAVENOUS
  Filled 2020-10-09 (×3): qty 1

## 2020-10-09 MED ORDER — MAGNESIUM SULFATE 2 GM/50ML IV SOLN
2.0000 g | Freq: Once | INTRAVENOUS | Status: AC
  Administered 2020-10-09: 2 g via INTRAVENOUS
  Filled 2020-10-09: qty 50

## 2020-10-09 MED ORDER — HYDROMORPHONE HCL 1 MG/ML IJ SOLN
1.0000 mg | INTRAMUSCULAR | Status: DC | PRN
  Administered 2020-10-09 (×2): 1 mg via INTRAVENOUS
  Filled 2020-10-09 (×2): qty 1

## 2020-10-09 MED ORDER — OXYCODONE HCL 5 MG PO TABS
5.0000 mg | ORAL_TABLET | ORAL | Status: DC | PRN
  Administered 2020-10-09 – 2020-10-11 (×3): 5 mg via ORAL
  Filled 2020-10-09 (×3): qty 1

## 2020-10-09 MED ORDER — SODIUM CHLORIDE 0.9% FLUSH
10.0000 mL | Freq: Three times a day (TID) | INTRAVENOUS | Status: DC
  Administered 2020-10-09 – 2020-10-10 (×4): 10 mL

## 2020-10-09 MED ORDER — BRINZOLAMIDE 1 % OP SUSP
1.0000 [drp] | Freq: Three times a day (TID) | OPHTHALMIC | Status: DC
  Administered 2020-10-09 – 2020-10-13 (×15): 1 [drp] via OPHTHALMIC
  Filled 2020-10-09: qty 10

## 2020-10-09 MED ORDER — LEVOFLOXACIN IN D5W 750 MG/150ML IV SOLN
750.0000 mg | INTRAVENOUS | Status: DC
  Administered 2020-10-09: 750 mg via INTRAVENOUS
  Filled 2020-10-09: qty 150

## 2020-10-09 MED ORDER — LOTEPREDNOL ETABONATE 0.38 % OP GEL: 1.0000 [drp] | Freq: Four times a day (QID) | OPHTHALMIC | Status: DC

## 2020-10-09 MED ORDER — LATANOPROST 0.005 % OP SOLN
1.0000 [drp] | Freq: Every day | OPHTHALMIC | Status: DC
  Administered 2020-10-09 – 2020-10-12 (×4): 1 [drp] via OPHTHALMIC
  Filled 2020-10-09: qty 2.5

## 2020-10-09 MED ORDER — INFLUENZA VAC A&B SA ADJ QUAD 0.5 ML IM PRSY: 0.5000 mL | PREFILLED_SYRINGE | INTRAMUSCULAR | Status: DC

## 2020-10-09 NOTE — Progress Notes (Signed)
Hypoglycemic Event  CBG: 65  Treatment: 4 oz juice/soda, RN assisted patient with eating breakfast tray as well  Symptoms: Hungry  Follow-up CBG: Time:0808 CBG Result:63  Possible Reasons for Event: Unknown  Comments/MD notified:Dr. Halford Chessman notified of persistent hypoglycemia despite 4 oz juice and breakfast, ordered 1/2 amp of dextrose IV.   Sofia Vanmeter A Dejah Droessler

## 2020-10-09 NOTE — Progress Notes (Signed)
Patient to room 2M03 via stretcher from ED. Patient is alert and oriented X4. Two PIV's on bilateral arms, NSL. Stage 2 noted on coccyx, foam placed. Patient has with him his personal cell phone, black pants, black blanket, and black socks. Urinal in room. Call bell given to patient. Patient repositioned and SCD;s placed on Bilateral legs. Continuing to monitor patient.

## 2020-10-09 NOTE — Progress Notes (Signed)
NAME:  Danny Mccall, MRN:  [id number], DOB:  07/02/1946, LOS: 1 ADMISSION DATE:  10/08/2020, CONSULTATION DATE: 10-08-20 REFERRING MD: Noemi Chapel, CHIEF COMPLAINT: Weakness and lethargy  Brief History:  75 yo male with hx of metastatic prostate cancer presented with weakness and altered mental status.  Found to have anemia, thrombocytopenia, lactic acidosis, hypotension, and spontaneous pneumothorax.  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  Significant Hospital Events:  1/28 Admit, transfuse PRBC and PLT 1/29 transfer to floor bed  Consults:  Palliative care  Procedures:  Rt chest tube 1/28 >>   Significant Diagnostic Tests:   CT chest 1/28 >> chest tube in major fissure, b/l pleural effusions large on Rt and small on Lt, small anteromedial PTX, RML and RLL ATX, patchy GGO in RLL, diffuse bony metastatic disease  CT abd/pelvis 1/28 >> innumerable hepatic metastatic lesions increased in size and number, suspicion for splenic metastatic lesions, increased amount of ascitic fluid, diffuse bony metastatic lesions  Micro Data:  COVID 1/28 >> negative MRSA PCR 1/29 >> negative Blood 1/29 >>   Antimicrobials:    Interim History / Subjective:  Current pain medication regimen not sufficient to control his pain symptoms.  He reports he has met with hospice as outpt.  He requested to speak with his wife to get further details when she comes to hospital later today.  Objective   BP (!) 86/53 (BP Location: Left Arm)   Pulse 90   Temp 98.5 F (36.9 C) (Oral)   Resp (!) 22   Ht 5' 11"  (1.803 m)   Wt 64.4 kg   SpO2 99%   BMI 19.80 kg/m   Intake/Output Summary (Last 24 hours) at 10/09/2020 0847 Last  data filed at 10/09/2020 0800 Gross per 24 hour  Intake 3844.09 ml  Output 1000 ml  Net 2844.09 ml   Filed Weights   10/09/20 0309  Weight: 64.4 kg    Examination:  General - thin Eyes - pupils reactive ENT - no sinus tenderness, no stridor Cardiac - regular rate/rhythm, no murmur Chest - equal breath sounds b/l, no wheezing or rales, Rt chest tube in place Abdomen - soft, non tender, + bowel sounds Extremities - decreased muscle bulk Skin - sacral pressure wound Neuro - moves extremities, follows commands  Resolved Hospital Problem list     Assessment & Plan:   Hypotension with lactic acidosis. - likely combination of severe anemia and volume depletion - continue IV fluids - f/u lactic acid level  Anemia of chronic disease in setting of metastatic prostate cancer with bone marrow suppression (has been transfusion dependent as outpt. Thrombocytopenia, leukopenia from bone marrow suppression. - followed at Dr. Alverda Skeans with [facility] by hematology/oncology - at time of last oncology visit on 09/01/20 he was advised that there were not further cancer therapies planned and advised to consider hospice - f/u CBC - transfuse for Hb < 7, PLT < 10K, or significant bleeding  Spontaneous Rt pneumothorax. - placed by ER - CT chest shows catheter in Rt major fissure - f/u CXR - chest tube to water seal  Hypomagnesemia. - f/u BMET  Pressure injury. - coccyx, present on admission - wound care  Cancer related cachexia. - encourage oral intake  Hypoglycemia. - dextrose in IV fluids - f/u blood sugar on BMET  Goals of care. - recently had assessment by hospice as outpt - limited resuscitation >> no CPR, no defibrillation, no ACLS medications - will ask palliative care team to consult to assist with pain management and further discussions about arrange for hospice care  Best practice (evaluated daily)  Diet: regular DVT prophylaxis: SCDs GI prophylaxis:  protonix Mobility: as tolerated Disposition: med surg  [facility] primary from 1/30 and PCCM will f/u for chest tube management  Labs    CMP Latest Ref Rng & Units 10/09/2020 10/08/2020 09/21/2020  Glucose 70 - 99 mg/dL 109(H) 103(H) 138(H)  BUN 8 - 23 mg/dL 27(H) 27(H) 22  Creatinine 0.61 - 1.24 mg/dL 1.02 1.14 0.96  Sodium 135 - 145 mmol/L 136 136 138  Potassium 3.5 - 5.1 mmol/L 4.3 4.2 4.1  Chloride 98 - 111 mmol/L 103 105 107  CO2 22 - 32 mmol/L 20(L) 16(L) 21(L)  Calcium 8.9 - 10.3 mg/dL 7.9(L) 8.0(L) 8.3(L)  Total Protein 6.5 - 8.1 g/dL - 4.5(L) -  Total Bilirubin 0.3 - 1.2 mg/dL - 2.0(H) -  Alkaline Phos 38 - 126 U/L - 214(H) -  AST 15 - 41 U/L - 58(H) -  ALT 0 - 44 U/L - 19 -    CBC Latest Ref Rng & Units 10/09/2020 10/09/2020 10/08/2020  WBC 4.0 - 10.5 K/uL 2.3(L) 2.6(L) 3.4(L)  Hemoglobin 13.0 - 17.0 g/dL 8.4(L) 8.6(L) 5.6(LL)  Hematocrit 39.0 - 52.0 % 25.4(L) 25.7(L) 17.6(L)  Platelets 150 - 400 K/uL 45(L) 48(L) 8(LL)    CBG (last 3)  Recent Labs    10/09/20 0722 10/09/20 0808 10/09/20 0844  GLUCAP 65* 63* 84   Signature:  Chesley Mires, MD [place] Pager - [phone] 10/09/2020, 9:21 AM

## 2020-10-09 NOTE — Progress Notes (Signed)
[place] Progress Note Patient Name: Danny Mccall DOB: September 30, 1945 MRN: [id number]   Date of Service  10/09/2020  HPI/Events of Note  Patient with metastatic prostate cancer receiving palliative radiation who is being admitted for severe anemia, thrombocytopenia, leukopenia and apparent spontaneous pneumothorax, he also had an elevate lactic acid level, and was hypotensive. Patient has already received blood and platelet transfusions, chest tube is to be placed.  eICU Interventions  New Patient Evaluation completed.        Kerry Kass Xavier Munger 10/09/2020, 2:43 AM

## 2020-10-09 NOTE — Consult Note (Cosign Needed Addendum)
Consultation Note Date: 10/09/2020   Patient Name: Danny Mccall  DOB: 27-Sep-1945  MRN: [id number]  Age / Sex: 75 y.o., male  PCP: Bernerd Limbo, MD Referring Physician: Chesley Mires, MD  Reason for Consultation: Establishing goals of care  HPI/Patient Profile: 75 yo male with hx of metastatic prostate cancer presented with weakness and altered mental status.  Found to have anemia, thrombocytopenia, lactic acidosis, hypotension, and spontaneous pneumothorax.  Clinical Assessment and Goals of Care: Patient is resting in bed with staff at bedside working to readjust and make him comfortable.  He states that he does not really have pain per se, he is just uncomfortable.  Spoke with his wife on patient's phone using speaker phone at his bedside.  She discusses that they are currently being followed by palliative medicine outpatient.  She states that physical therapy comes in to see him weekly.  She states his pain is pretty well managed at home.   We discussed his diagnosis, prognosis, GOC, EOL wishes disposition and options.  A detailed discussion was had today regarding advanced directives.  Concepts specific to code status, artifical feeding and hydration, IV antibiotics and rehospitalization were discussed.  The difference between an aggressive medical intervention path and a comfort care path focusing on his comfort and dignity was discussed in detail.  Values and goals of care important to patient and family were attempted to be elicited.  Discussed limitations of medical interventions to prolong quality of life in some situations and discussed the concept of human mortality.  She states that she will need to speak to the patient about how to move forward with care.  She states the decision has been made already not to do chest compressions for cardiopulmonary arrest, but as of this time the plan is for him to receive all other care.  Discussed quality of  life and concern for future symptom management needs given his current frailty, lethargy, and soft blood pressures.  She states that she will come to the hospital and we can talk further.  ADDENDUM: Wife is at bedside. Patient states he wants to go home. Wife states she really wants him to be able to go home before he dies, so does not want to switch to complete comfort care until he is discharging. She would like to continue current care until chest tube can be removed, or changed to a pleurex if needed due to reaccumulation. She states she does not want escalation in care as any need for escalation in care would mean he would not be able to D/C home, and would die here in the hospital. She is aware he could die in the hospital with current care. She states she would not want ventilator support.  MOST form completed. Home with hospice.   I completed a MOST form today and the signed original was placed in the chart. A photocopy was placed in the chart to be scanned into EMR. The patient outlined their wishes for the following treatment decisions:  Cardiopulmonary Resuscitation: Do Not Attempt Resuscitation (DNR/No CPR)  Medical Interventions: Comfort Measures: Keep clean, warm, and dry. Use medication by any route, positioning, wound care, and other measures to relieve pain and suffering. Use oxygen, suction and manual treatment of airway obstruction as needed for comfort. Do not transfer to the hospital unless comfort needs cannot be met in current location. Start comfort care after this hospitalization so he can go home. No escalation in care during this hospitalization.   Antibiotics: No antibiotics (  use other measures to relieve symptoms)  IV Fluids: No IV fluids (provide other measures to ensure comfort)  Feeding Tube: No feeding tube       SUMMARY OF RECOMMENDATIONS   Discussed continued aggressive care versus comfort focused care.  Wife states she will talk to patient about care moving forward.   He is currently followed by outpatient palliative per wife.  Recommend Reglan or Compazine for N/V refractory to Zofran.  Patient currently states he does not have pain and current medications are effective, he is just unable to get comfortable. If patient/family decide on continuing to treat the treatable with outpatient palliative: -- Could increase Oxycodone to range of 5-61m q 4 PRN, or could continue 564mand increase frequency to every 3 hours PRN.  -- Could use Acetaminophen up to 2,00033mer day.  -- Could implement Dexamethasone 4-8mg6m q day.  -- Could also implement Zoledronic acid IV q 3 weeks with initial dosing here, and follow up with cancer center palliative NP.   If patient and family decide on comfort focused care with hospice would recommend increasing narcotic pain medication as needed for pain management. Could also use Dexamethasone as above.   ADDENDUM: D/C home with hospice when definitive chest tube management completed. Shift to comfort care at that time. No escalation in care.   Prognosis:   < 6 months      Primary Diagnoses: Present on Admission: . Shock (HCC)[place] have reviewed the medical record, interviewed the patient and family, and examined the patient. The following aspects are pertinent.  Past Medical History:  Diagnosis Date  . Anemia   . Diabetes mellitus without complication (HCC)[place] 11/271/16/57"Xt have it any longer. " Last A1C was 6.2"  . Duodenal ulcer 10/2017  . History of blood transfusion    numerous at a [facility] facility  . Hypertension   . Open-angle glaucoma    bilateral  . Panuveitis, both eyes   . Peripheral focal chorioretinal inflammation    both eyes  . Prostate cancer (HCC)[facility] Gleason 9, Bone Metastasis   Social History   Socioeconomic History  . Marital status: Married    Spouse name: Not on file  . Number of children: 9  .74Years of education: Not on file  . Highest education level: Not on file  Occupational  History  . Occupation: retired  Tobacco Use  . Smoking status: Former Smoker    Packs/day: 0.50    Years: 20.00    Pack years: 10.00    Types: Cigarettes    Quit date: 11/09/2016    Years since quitting: 3.9  . Smokeless tobacco: Never Used  . Tobacco comment: "smoked off and on"  Vaping Use  . Vaping Use: Never used  Substance and Sexual Activity  . Alcohol use: Not Currently  . Drug use: No  . Sexual activity: Not on file  Other Topics Concern  . Not on file  Social History Narrative  . Not on file   Social Determinants of Health   Financial Resource Strain: Not on file  Food Insecurity: Not on file  Transportation Needs: Not on file  Physical Activity: Not on file  Stress: Not on file  Social Connections: Not on file   Family History  Problem Relation Age of Onset  . Diabetes Mother   . Cancer Father        throat  . Colon cancer Other  sister's child   Scheduled Meds: . brimonidine  1 drop Both Eyes TID  . brinzolamide  1 drop Both Eyes TID  . Chlorhexidine Gluconate Cloth  6 each Topical Daily  . latanoprost  1 drop Both Eyes QHS  . sodium chloride flush  10 mL Intracatheter Q8H   Continuous Infusions: . dextrose 5 % and 0.9% NaCl 75 mL/hr at 10/09/20 1022   PRN Meds:.docusate sodium, HYDROmorphone (DILAUDID) injection, ondansetron, oxyCODONE, polyethylene glycol Medications Prior to Admission:  Prior to Admission medications   Medication Sig Start Date End Date Taking? Authorizing Provider  SIMBRINZA 1-0.2 % SUSP Place 1 drop into both eyes in the morning, at noon, and at bedtime.  08/24/17  Yes [provider]  diclofenac Sodium (VOLTAREN) 1 % GEL Apply 2 g topically daily as needed (pain).    [provider]  docusate sodium (COLACE) 100 MG capsule Take 100 mg by mouth daily.    [provider]  ipratropium (ATROVENT) 0.03 % nasal spray Place 2 sprays into both nostrils every 12 (twelve) hours.    [provider]   latanoprost (XALATAN) 0.005 % ophthalmic solution Place 1 drop into both eyes at bedtime.    [provider]  loratadine (CLARITIN) 10 MG tablet Take 10 mg by mouth daily.    [provider]  LOTEMAX SM 0.38 % GEL Place 1 drop into both eyes 4 (four) times daily. 06/08/20   [provider]  oxyCODONE (OXY IR/ROXICODONE) 5 MG immediate release tablet Take 5 mg by mouth 2 (two) times daily as needed for moderate pain or severe pain.     [provider]  polyethylene glycol (MIRALAX) 17 g packet Take 17 g by mouth daily as needed (constipation). Patient taking differently: Take 17 g by mouth daily as needed for mild constipation (constipation). 05/05/19   McDonald, Mia A, PA-C   Allergies  Allergen Reactions  . Penicillins Hives and Rash   Review of Systems  Constitutional:       Voices overall discomfort.  Denies actual pain.    Physical Exam Constitutional:      Comments: Thin and frail.  Pulmonary:     Effort: Pulmonary effort is normal.  Neurological:     Mental Status: He is alert.     Vital Signs: BP 93/60   Pulse 77   Temp 98.5 F (36.9 C) (Oral)   Resp (!) 9   Ht 5' 11"  (1.803 m)   Wt 64.4 kg   SpO2 95%   BMI 19.80 kg/m  Pain Scale: 0-10   Pain Score: 7    SpO2: SpO2: 95 % O2 Device:SpO2: 95 % O2 Flow Rate: .   IO: Intake/output summary:   Intake/Output Summary (Last 24 hours) at 10/09/2020 1307 Last data filed at 10/09/2020 1200 Gross per 24 hour  Intake 3884.09 ml  Output 1000 ml  Net 2884.09 ml    LBM: Last BM Date: 10/09/20 Baseline Weight: Weight: 64.4 kg Most recent weight: Weight: 64.4 kg         Time In: 11:00 Time Out: 11:50 Time Total: 50 min Greater than 50%  of this time was spent counseling and coordinating care related to the above assessment and plan.  Signed by: Asencion Gowda, NP   Please contact Palliative Medicine Team phone at [phone] for questions and concerns.  For individual  provider: See Shea Evans

## 2020-10-09 NOTE — Progress Notes (Signed)
Patient difficult to arouse following administration of ordered prn Phenergan and Dilaudid. Dr. Halford Chessman notified, phenergan order discontinued and Dilaudid dose decreased.

## 2020-10-10 ENCOUNTER — Encounter (HOSPITAL_COMMUNITY): Payer: Self-pay | Admitting: Pulmonary Disease

## 2020-10-10 ENCOUNTER — Inpatient Hospital Stay (HOSPITAL_COMMUNITY): Payer: Medicare HMO

## 2020-10-10 DIAGNOSIS — J9383 Other pneumothorax: Secondary | ICD-10-CM

## 2020-10-10 DIAGNOSIS — J9311 Primary spontaneous pneumothorax: Secondary | ICD-10-CM

## 2020-10-10 DIAGNOSIS — R579 Shock, unspecified: Secondary | ICD-10-CM | POA: Diagnosis not present

## 2020-10-10 DIAGNOSIS — C61 Malignant neoplasm of prostate: Secondary | ICD-10-CM | POA: Diagnosis not present

## 2020-10-10 DIAGNOSIS — Z515 Encounter for palliative care: Secondary | ICD-10-CM | POA: Diagnosis not present

## 2020-10-10 DIAGNOSIS — Z7189 Other specified counseling: Secondary | ICD-10-CM | POA: Diagnosis not present

## 2020-10-10 DIAGNOSIS — C7951 Secondary malignant neoplasm of bone: Secondary | ICD-10-CM

## 2020-10-10 LAB — GLUCOSE, CAPILLARY
Glucose-Capillary: 102 mg/dL — ABNORMAL HIGH (ref 70–99)
Glucose-Capillary: 106 mg/dL — ABNORMAL HIGH (ref 70–99)
Glucose-Capillary: 66 mg/dL — ABNORMAL LOW (ref 70–99)
Glucose-Capillary: 69 mg/dL — ABNORMAL LOW (ref 70–99)
Glucose-Capillary: 74 mg/dL (ref 70–99)
Glucose-Capillary: 76 mg/dL (ref 70–99)
Glucose-Capillary: 76 mg/dL (ref 70–99)
Glucose-Capillary: 77 mg/dL (ref 70–99)
Glucose-Capillary: 77 mg/dL (ref 70–99)
Glucose-Capillary: 82 mg/dL (ref 70–99)
Glucose-Capillary: 84 mg/dL (ref 70–99)

## 2020-10-10 LAB — BASIC METABOLIC PANEL
Anion gap: 11 (ref 5–15)
BUN: 31 mg/dL — ABNORMAL HIGH (ref 8–23)
CO2: 16 mmol/L — ABNORMAL LOW (ref 22–32)
Calcium: 7.7 mg/dL — ABNORMAL LOW (ref 8.9–10.3)
Chloride: 108 mmol/L (ref 98–111)
Creatinine, Ser: 1.22 mg/dL (ref 0.61–1.24)
GFR, Estimated: >60 mL/min (ref >60)
Glucose, Bld: 179 mg/dL — ABNORMAL HIGH (ref 70–99)
Potassium: 4.3 mmol/L (ref 3.5–5.1)
Sodium: 135 mmol/L (ref 135–145)

## 2020-10-10 LAB — MAGNESIUM: Magnesium: 1.9 mg/dL (ref 1.7–2.4)

## 2020-10-10 LAB — URINE CULTURE: Culture: >=100000 — AB

## 2020-10-10 LAB — CBC
HCT: 20 % — ABNORMAL LOW (ref 39.0–52.0)
Hemoglobin: 6.6 g/dL — CL (ref 13.0–17.0)
MCH: 30.8 pg (ref 26.0–34.0)
MCHC: 33 g/dL (ref 30.0–36.0)
MCV: 93.5 fL (ref 80.0–100.0)
Platelets: 25 10*3/uL — CL (ref 150–400)
RBC: 2.14 MIL/uL — ABNORMAL LOW (ref 4.22–5.81)
RDW: 16.5 % — ABNORMAL HIGH (ref 11.5–15.5)
WBC: 1.5 10*3/uL — ABNORMAL LOW (ref 4.0–10.5)
nRBC: 13.2 % — ABNORMAL HIGH (ref 0.0–0.2)

## 2020-10-10 LAB — PREPARE RBC (CROSSMATCH)

## 2020-10-10 LAB — LACTIC ACID, PLASMA: Lactic Acid, Venous: 2 mmol/L (ref 0.5–1.9)

## 2020-10-10 MED ORDER — SODIUM CHLORIDE 0.9% IV SOLUTION: Freq: Once | INTRAVENOUS | Status: AC

## 2020-10-10 MED ORDER — DEXTROSE 50 % IV SOLN
INTRAVENOUS | Status: AC
  Administered 2020-10-10: 50 mL
  Filled 2020-10-10: qty 50

## 2020-10-10 MED ORDER — SENNA 8.6 MG PO TABS
1.0000 | ORAL_TABLET | Freq: Every day | ORAL | Status: DC
  Administered 2020-10-10 – 2020-10-13 (×3): 8.6 mg via ORAL
  Filled 2020-10-10 (×3): qty 1

## 2020-10-10 NOTE — Progress Notes (Addendum)
Progress Note    Danny Mccall [place]  [id number] DOB: 17-Mar-1946  DOA: 10/08/2020 PCP: Bernerd Limbo, MD      Brief Narrative:    Medical records reviewed and are as summarized below:  Danny Mccall is a 75 y.o. male with hx of metastatic prostate cancer presented with weakness and altered mental status.  Found to have anemia, thrombocytopenia, lactic acidosis, hypotension, and spontaneous pneumothorax.      Assessment/Plan:   Principal Problem:   Spontaneous pneumothorax Active Problems:   Shock ([place])   Pressure injury of skin   Body mass index is 19.86 kg/m.     Spontaneous Rt pneumothorax. Chest tube was placed by ED physician on 10/08/2020. Chest tube was removed today. Repeat chest x-ray tomorrow. Follow-up with intensivist.  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 Thrombocytopenia, leukopenia from bone marrow suppression. S/p transfusion with 1 unit of PRBCs on 10/10/2020. S/p transfusion with 2 units of PRBCs 10/08/2020. S/p transfusion 1 unit of platelets on 10/08/2020.  - followed at Dr. Alverda Skeans with [facility] by hematology/oncology - at time of last oncology visit on 09/01/20 he was advised that there were not further cancer therapies planned and advised to consider hospice - f/u CBC - transfuse for Hb < 7, PLT < 10K, or significant bleeding  Stage II coccygeal decubitus ulcer -present on admission - wound care  Cancer related cachexia. - encourage oral intake  Hypoglycemia. - dextrose in IV fluids  Hypotension Resolved    Pressure Injury 10/09/20 Coccyx Medial Stage 2 -  Partial thickness loss of dermis presenting as a shallow open injury with a red, pink wound bed without slough. (Active)  10/09/20 0220  Location: Coccyx  Location Orientation: Medial  Staging: Stage 2 -  Partial thickness loss of dermis  presenting as a shallow open injury with a red, pink wound bed without slough.  Wound Description (Comments):   Present on Admission: Yes        Diet Order            Diet regular Room service appropriate? Yes; Fluid consistency: Thin  Diet effective now                    Consultants:  Intensivist  Procedures:  Chest tube placed on 10/08/2020    Medications:   . brimonidine  1 drop Both Eyes TID  . brinzolamide  1 drop Both Eyes TID  . Chlorhexidine Gluconate Cloth  6 each Topical Daily  . latanoprost  1 drop Both Eyes QHS  . senna  1 tablet Oral Daily  . sodium chloride flush  10 mL Intracatheter Q8H   Continuous Infusions: . dextrose 5 % and 0.9% NaCl 75 mL/hr at 10/10/20 0900     Anti-infectives (From admission, onward)   Start     Dose/Rate Route Frequency Ordered Stop   10/09/20 0015  levofloxacin (LEVAQUIN) IVPB 750 mg  Status:  Discontinued        750 mg 100 mL/hr over 90 Minutes Intravenous Every 24 hours 10/09/20 0016 10/09/20 0925             Family Communication/Anticipated D/C date and plan/Code Status   DVT prophylaxis: SCDs Start: 10/08/20 2139     Code Status: Partial Code  Family Communication: None Disposition Plan:    Status is: Inpatient  Remains inpatient appropriate because:Inpatient level of care appropriate due  to severity of illness   Dispo: The patient is from: Home              Anticipated d/c is to: Home              Anticipated d/c date is: 1 day              Patient currently is not medically stable to d/c.   Difficult to place patient No           Subjective:   C/o right-sided chest pain  Objective:    Vitals:   10/10/20 0845 10/10/20 0900 10/10/20 1000 10/10/20 1100  BP:  99/60 98/64   Pulse:      Resp: (!) 26 (!) 8 (!) 8   Temp:    (!) 97.4 F (36.3 C)  TempSrc:      SpO2:      Weight:      Height:       No data found.   Intake/Output Summary (Last 24 hours) at 10/10/2020  1204 Last data filed at 10/10/2020 0900 Gross per 24 hour  Intake 2088.72 ml  Output 796 ml  Net 1292.72 ml   Filed Weights   10/09/20 0309 10/10/20 0500  Weight: 64.4 kg 64.6 kg    Exam:  GEN: NAD SKIN: Warm and dry. Stage II coccygeal decubitus ulcer EYES: EOMI ENT: MMM CV: RRR PULM: CTA B. Right-sided chest tube has been removed ABD: soft, ND, NT, +BS CNS: AAO x 3, non focal EXT: No edema or tenderness   Data Reviewed:   I have personally reviewed following labs and imaging studies:  Labs: Labs show the following:   Basic Metabolic Panel: Recent Labs  Lab 10/08/20 1817 10/09/20 0206 10/10/20 0117  NA 136 136 135  K 4.2 4.3 4.3  CL 105 103 108  CO2 16* 20* 16*  GLUCOSE 103* 109* 179*  BUN 27* 27* 31*  CREATININE 1.14 1.02 1.22  CALCIUM 8.0* 7.9* 7.7*  MG  --  1.6* 1.9  PHOS  --  5.0*  --    GFR Estimated Creatinine Clearance: 49.3 mL/min (by C-G formula based on SCr of 1.22 mg/dL). Liver Function Tests: Recent Labs  Lab 10/08/20 1817  AST 58*  ALT 19  ALKPHOS 214*  BILITOT 2.0*  PROT 4.5*  ALBUMIN 2.0*   No results for input(s): LIPASE, AMYLASE in the last 168 hours. No results for input(s): AMMONIA in the last 168 hours. Coagulation profile Recent Labs  Lab 10/08/20 1817  INR 1.6*    CBC: Recent Labs  Lab 10/08/20 1817 10/09/20 0206 10/09/20 0305 10/10/20 0117  WBC 3.4* 2.6* 2.3* 1.5*  NEUTROABS 1.2*  --   --   --   HGB 5.6* 8.6* 8.4* 6.6*  HCT 17.6* 25.7* 25.4* 20.0*  MCV 94.6 91.8 91.7 93.5  PLT 8* 48* 45* 25*   Cardiac Enzymes: No results for input(s): CKTOTAL, CKMB, CKMBINDEX, TROPONINI in the last 168 hours. BNP (last 3 results) No results for input(s): PROBNP in the last 8760 hours. CBG: Recent Labs  Lab 10/10/20 0332 10/10/20 0631 10/10/20 0646 10/10/20 0716 10/10/20 1123  GLUCAP 82 66* 102* 84 77   D-Dimer: No results for input(s): DDIMER in the last 72 hours. Hgb A1c: No results for input(s): HGBA1C in  the last 72 hours. Lipid Profile: No results for input(s): CHOL, HDL, LDLCALC, TRIG, CHOLHDL, LDLDIRECT in the last 72 hours. Thyroid function studies: No results for input(s): TSH, T4TOTAL, T3FREE,  THYROIDAB in the last 72 hours.  Invalid input(s): FREET3 Anemia work up: No results for input(s): VITAMINB12, FOLATE, FERRITIN, TIBC, IRON, RETICCTPCT in the last 72 hours. Sepsis Labs: Recent Labs  Lab 10/08/20 1817 10/09/20 0206 10/09/20 0305 10/10/20 0117  WBC 3.4* 2.6* 2.3* 1.5*  LATICACIDVEN 8.1*  --  1.9 2.0*    Microbiology Recent Results (from the past 240 hour(s))  SARS CORONAVIRUS 2 (TAT 6-24 HRS) Nasopharyngeal Nasopharyngeal Swab     Status: None   Collection Time: 10/08/20  6:29 PM   Specimen: Nasopharyngeal Swab  Result Value Ref Range Status   SARS Coronavirus 2 NEGATIVE NEGATIVE Final    Comment: (NOTE) SARS-CoV-2 target nucleic acids are NOT DETECTED.  The SARS-CoV-2 RNA is generally detectable in upper and lower respiratory specimens during the acute phase of infection. Negative results do not preclude SARS-CoV-2 infection, do not rule out co-infections with other pathogens, and should not be used as the sole basis for treatment or other patient management decisions. Negative results must be combined with clinical observations, patient history, and epidemiological information. The expected result is Negative.  Fact Sheet for Patients: SugarRoll.be  Fact Sheet for Healthcare Providers: https://www.woods-mathews.com/  This test is not yet approved or cleared by the [place] FDA and  has been authorized for detection and/or diagnosis of SARS-CoV-2 by FDA under an Emergency Use Authorization (EUA). This EUA will remain  in effect (meaning this test can be used) for the duration of the COVID-19 declaration under Se ction 564(b)(1) of the Act, 21 U.S.C. section 360bbb-3(b)(1), unless the authorization is terminated  or revoked sooner.  Performed at [facility], [place] [address], [place], [place] [address]   Blood culture (routine single)     Status: None (Preliminary result)   Collection Time: 10/08/20  9:23 PM   Specimen: BLOOD LEFT ARM  Result Value Ref Range Status   Specimen Description BLOOD LEFT ARM  Final   Special Requests   Final    BOTTLES DRAWN AEROBIC AND ANAEROBIC Blood Culture adequate volume   Culture   Final    NO GROWTH 2 DAYS Performed at [facility] [address], [place], [place] [address]    Report Status PENDING  Incomplete  MRSA PCR Screening     Status: None   Collection Time: 10/09/20  2:23 AM   Specimen: Nasal Mucosa; Nasopharyngeal  Result Value Ref Range Status   MRSA by PCR NEGATIVE NEGATIVE Final    Comment:        The GeneXpert MRSA Assay (FDA approved for NASAL specimens only), is one component of a comprehensive MRSA colonization surveillance program. It is not intended to diagnose MRSA infection nor to guide or monitor treatment for MRSA infections. Performed at [facility], [place] [address], [place], [place] [address]   Culture, blood (routine x 2)     Status: None (Preliminary result)   Collection Time: 10/09/20  3:07 AM   Specimen: BLOOD  Result Value Ref Range Status   Specimen Description BLOOD LEFT ANTECUBITAL  Final   Special Requests   Final    BOTTLES DRAWN AEROBIC AND ANAEROBIC Blood Culture adequate volume   Culture   Final    NO GROWTH 1 DAY Performed at [facility], [place] [address] [place], [place] [address]    Report Status PENDING  Incomplete  Culture, blood (routine x 2)     Status: None (Preliminary result)   Collection Time: 10/09/20  3:12 AM   Specimen: BLOOD  Result Value Ref Range Status   Specimen Description BLOOD RIGHT ANTECUBITAL  Final   Special Requests   Final    BOTTLES DRAWN AEROBIC ONLY Blood Culture results may not be optimal due to an inadequate volume of blood received in culture  bottles   Culture   Final    NO GROWTH 1 DAY Performed at [facility] [address], [place], [place] [address]    Report Status PENDING  Incomplete  Urine culture     Status: None (Preliminary result)   Collection Time: 10/09/20  5:24 AM   Specimen: In/Out Cath Urine  Result Value Ref Range Status   Specimen Description IN/OUT CATH URINE  Final   Special Requests NONE  Final   Culture   Final    CULTURE REINCUBATED FOR BETTER GROWTH Performed at [facility], [place] [address], [place], [place] [address]    Report Status PENDING  Incomplete    Procedures and diagnostic studies:  DG Chest 1 View  Result Date: 10/08/2020 CLINICAL DATA:  Sepsis.  Prostate cancer. EXAM: CHEST  1 VIEW COMPARISON:  07/04/1999 FINDINGS: Heart size and vascularity normal.  Left lung clear Right lower lobe atelectasis/infiltrate. Probable right-sided pneumothorax. Skin fold considered less likely. Correlate with symptoms. Follow-up chest x-ray could confirm. Small right effusion Sclerotic skeletal lesions compatible with diffuse bony metastatic disease. IMPRESSION: Probable right pneumothorax and right lower lobe atelectasis. Small right effusion. Recommend follow-up chest x-ray to confirm. Diffuse bony metastasis. Electronically Signed   By: Franchot Gallo M.D.   On: 10/08/2020 18:40   CT CHEST ABDOMEN PELVIS W CONTRAST  Result Date: 10/08/2020 CLINICAL DATA:  History of prostate carcinoma and recent chest tube placement EXAM: CT CHEST, ABDOMEN, AND PELVIS WITH CONTRAST TECHNIQUE: Multidetector CT imaging of the chest, abdomen and pelvis was performed following the standard protocol during bolus administration of intravenous contrast. CONTRAST:  113mL OMNIPAQUE IOHEXOL 300 MG/ML  SOLN COMPARISON:  Chest x-rays from earlier in the same day. FINDINGS: CT CHEST FINDINGS Cardiovascular: Thoracic aorta and its branches are within normal limits. Mild atherosclerotic calcifications are seen. No aneurysmal  dilatation or dissection is seen. Heart size is within normal limits. Pulmonary artery shows no large central pulmonary embolus. Mediastinum/Nodes: Thoracic inlet is within normal limits. No sizable hilar or mediastinal adenopathy is noted. The esophagus as visualized is within normal limits. Lungs/Pleura: New chest tube is been placed on the right and lies within the major fissure and extends to the right cardiac border. Bilateral pleural effusions are noted large on the right and small on the left. The pleural fluid on the right is not being adequately presented to the chest tube. Small anteromedial pneumothorax is noted on the right. Right lower lobe and middle lobe atelectatic changes are seen. Patchy ground-glass changes are noted in the right lower lobe. Correlate with COVID-19 testing which is pending. Left lung demonstrates some lower lobe atelectatic changes as well. Musculoskeletal: Diffuse bony metastatic disease is noted. No compression deformity or acute rib abnormality is seen. These changes are stable from previous exams. CT ABDOMEN PELVIS FINDINGS Hepatobiliary: Innumerable hepatic metastatic lesions are noted which have increased in both size and number when compared with the prior exam. Gallbladder has been surgically removed. Pancreas: Unremarkable. No pancreatic ductal dilatation or surrounding inflammatory changes. Spleen: Mottled density within the spleen is noted suspicious for metastatic disease as well. Adrenals/Urinary Tract: Adrenal glands are within normal limits. Kidneys demonstrate a  normal enhancement pattern. No renal calculi or obstructive changes are noted. Bladder is well distended. Stomach/Bowel: Appendix is not well visualized. No obstructive or inflammatory changes of the colon are seen. Diverticular change of the colon is noted without diverticulitis. The stomach and small bowel appear within normal limits. Vascular/Lymphatic: Aortic atherosclerosis. No enlarged abdominal or  pelvic lymph nodes. Reproductive: Prostate demonstrates planning markers within. Other: Ascites is seen increased when compared with the prior exam. Mild changes of anasarca are noted as well. Musculoskeletal: Diffuse sclerotic bony metastatic disease is noted without evidence of compression deformity. IMPRESSION: CT of the chest: Bilateral pleural effusions right considerably greater than left. The indwelling chest tube on the right is not within the pleural effusion and may not affect adequate drainage. Small right anteromedial pneumothorax. Given the tube position within the major fissure this may also not resolve with the current tube drainage. Diffuse bony metastatic disease. CT of the abdomen and pelvis: Hepatic metastatic disease which is increased in both size and number when compared with the prior study. Diverticulosis without diverticulitis. New ascites and mild changes of anasarca. Diffuse sclerotic bony metastatic disease consistent with the given clinical history. Electronically Signed   By: Inez Catalina M.D.   On: 10/08/2020 23:48   DG Chest Port 1 View  Result Date: 10/10/2020 CLINICAL DATA:  Pneumothorax on the right EXAM: PORTABLE CHEST 1 VIEW COMPARISON:  Yesterday FINDINGS: Chest tube at the right base.  Trace right apical pneumothorax. Hazy density of the left more than right chest, atelectasis and pleural fluid based on recent chest CT. Normal heart size. Confluent osseous metastatic disease. IMPRESSION: 1. Trace right apical pneumothorax. 2. Hazy density of the left chest from atelectasis and pleural fluid by recent chest CT. Electronically Signed   By: Monte Fantasia M.D.   On: 10/10/2020 05:41   DG CHEST PORT 1 VIEW  Result Date: 10/09/2020 CLINICAL DATA:  Pneumothorax. EXAM: PORTABLE CHEST 1 VIEW COMPARISON:  Chest x-rays dated 10/08/2020 FINDINGS: Heart size and mediastinal contours are within normal limits. RIGHT-sided chest tube is stable in position at the RIGHT lung base. No  residual pneumothorax is seen. Probable mild bilateral edema and/or small layering pleural effusions. Diffuse osseous metastases better demonstrated on recent chest CT of 10/08/2020. IMPRESSION: 1. No pneumothorax is seen. RIGHT-sided chest tube is stable in position. 2. Probable bilateral interstitial edema and/or small layering pleural effusions. 3. Diffuse osseous metastases. Electronically Signed   By: Franki Cabot M.D.   On: 10/09/2020 09:13   DG Chest Port 1 View  Result Date: 10/08/2020 CLINICAL DATA:  Chest tube placement EXAM: PORTABLE CHEST 1 VIEW COMPARISON:  10/08/2020, CT 07/05/2019 FINDINGS: Interval placement of right-sided chest tube with pigtail overlying the spine. Asymmetric hazy opacity in the right thorax could be secondary to pleural effusion or asymmetric edema. No definitive pneumothorax. Diffuse skeletal sclerosis consistent with metastatic disease. IMPRESSION: Interval placement of right-sided chest tube with asymmetric hazy opacity of the right thorax, question pleural effusion or asymmetric edema. No pneumothorax. Electronically Signed   By: Donavan Foil M.D.   On: 10/08/2020 22:44   DG Abd 2 Views  Result Date: 10/08/2020 CLINICAL DATA:  Sepsis EXAM: ABDOMEN - 2 VIEW COMPARISON:  CT abdomen pelvis 08/28/2020 FINDINGS: Normal bowel gas pattern. No obstruction or ileus. No free air on upright view. Widespread bony metastatic disease. IMPRESSION: Normal bowel gas pattern Diffuse skeletal metastasis. Electronically Signed   By: Franchot Gallo M.D.   On: 10/08/2020 18:41  LOS: 2 days   Shatoya Roets  [facility]   Pager on [email or web address]. If 7PM-7AM, please contact night-coverage at [email or web address]     10/10/2020, 12:04 PM

## 2020-10-10 NOTE — Progress Notes (Signed)
Patient attempting to get out of bed, pulling off his gown, pulling on cords, and chest tube. Patient was very hard to redirect. He kept saying "I have to pee" with unsuccessful attempts to do so. Patient c/o pain at chest tube site. PRN oxy given. Patient is resting in bed at the moment.

## 2020-10-10 NOTE — Progress Notes (Signed)
CRITICAL VALUE ALERT  Critical Value:  Hgb=6.6  Date & Time Notied:  10/10/2020  0250  Provider Notified: elink  Orders Received/Actions taken: awaiting new orders

## 2020-10-10 NOTE — Progress Notes (Addendum)
[facility] Progress Note Patient Name: Danny Mccall DOB: [birth year] MRN: [id number]   Date of Service  10/10/2020  HPI/Events of Note  Anemia - Hgb = 6.6 and Thrombocytopenia - Platelets = 25K. Transfuse platelets for active bleeding or platelets < 10K.  eICU Interventions  Plan: 1. Transfuse 1 unit PRBC now.      Intervention Category Major Interventions: Other:  Lysle Dingwall 10/10/2020, 3:34 AM

## 2020-10-10 NOTE — Progress Notes (Signed)
NAME:  Danny Mccall, MRN:  [id number], DOB:  26-Jul-1946, LOS: 2 ADMISSION DATE:  10/08/2020, CONSULTATION DATE: 10-08-20 REFERRING MD: Noemi Chapel, CHIEF COMPLAINT: Weakness and lethargy  Brief History:  75 yo male with hx of metastatic prostate cancer presented with weakness and altered mental status.  Found to have anemia, thrombocytopenia, lactic acidosis, hypotension, and spontaneous pneumothorax.  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  Significant Hospital Events:  1/28 Admit, transfuse PRBC and PLT 1/29 seen by palliative care >> working on home hospice set up  Consults:  Palliative care  Procedures:  Rt chest tube 1/28 >>   Significant Diagnostic Tests:   CT chest 1/28 >> chest tube in major fissure, b/l pleural effusions large on Rt and small on Lt, small anteromedial PTX, RML and RLL ATX, patchy GGO in RLL, diffuse bony metastatic disease  CT abd/pelvis 1/28 >> innumerable hepatic metastatic lesions increased in size and number, suspicion for splenic metastatic lesions, increased amount of ascitic fluid, diffuse bony metastatic lesions  Micro Data:  COVID 1/28 >> negative MRSA PCR 1/29 >> negative Blood 1/29 >>   Antimicrobials:    Interim History / Subjective:  Breathing okay.  Not having chest pain or cough.  Objective   BP (!) 96/59   Pulse 95   Temp 97.9 F (36.6 C) (Axillary)   Resp (!) 9   Ht 5\' 11"  (1.803 m)   Wt 64.6 kg   SpO2 96%   BMI 19.86 kg/m   Intake/Output Summary (Last 24 hours) at 10/10/2020 0758 Last data filed at 10/10/2020 0600 Gross per 24 hour  Intake 2143.91 ml  Output 796 ml  Net 1347.91 ml   Filed Weights   10/09/20 0309 10/10/20 0500  Weight:  64.4 kg 64.6 kg    Examination:  General - thin, sitting in chair Eyes - pupils reactive ENT - no sinus tenderness, no stridor Cardiac - regular rate/rhythm, no murmur Chest - no wheeze, Rt pig tail in place w/o air leak Abdomen - soft, non tender, + bowel sounds Extremities - decreased muscle bulk Skin - no rashes Neuro - moves extremities, follows commands   Resolved Hospital Problem list   Hypotension and lactic acidosis from hypovolemia and severe anemia  Assessment & Plan:   Spontaneous Rt pneumothorax. - placed by ER - CT chest shows catheter in Rt major fissure - no air leak and only tiny PTX on CXR from 1/30 - will have chest tube removed on 1/30 and f/u CXR after and on 1/31 - if he develops progression of pleural fluid causing significant symptoms, then could consider placing pleurx catheter which would allow for him to be managed as an outpt  Anemia of chronic disease in setting of metastatic prostate cancer with bone marrow suppression (has been transfusion dependent as outpt. Thrombocytopenia, leukopenia from bone marrow suppression. - followed at Dr. Alverda Skeans with [facility] by hematology/oncology - at time of last oncology visit on 09/01/20 he was advised that there were not further cancer therapies planned and advised to consider hospice - f/u CBC - transfuse for Hb < 7, PLT < 10K, or significant bleeding  Pressure injury. - coccyx, present on admission - wound care  Cancer related cachexia. - encourage  oral intake  Hypoglycemia. - dextrose in IV fluids  Goals of care. - recently had assessment by hospice as outpt - limited resuscitation >> no CPR, no defibrillation, no ACLS medications - seen by palliative care on 1/29 >> trying to arrange for home hospice  Best practice (evaluated daily)  Diet: regular DVT prophylaxis: SCDs GI prophylaxis: protonix Mobility: as tolerated Disposition: med surg  Labs    CMP Latest Ref Rng & Units 10/10/2020  10/09/2020 10/08/2020  Glucose 70 - 99 mg/dL 179(H) 109(H) 103(H)  BUN 8 - 23 mg/dL 31(H) 27(H) 27(H)  Creatinine 0.61 - 1.24 mg/dL 1.22 1.02 1.14  Sodium 135 - 145 mmol/L 135 136 136  Potassium 3.5 - 5.1 mmol/L 4.3 4.3 4.2  Chloride 98 - 111 mmol/L 108 103 105  CO2 22 - 32 mmol/L 16(L) 20(L) 16(L)  Calcium 8.9 - 10.3 mg/dL 7.7(L) 7.9(L) 8.0(L)  Total Protein 6.5 - 8.1 g/dL - - 4.5(L)  Total Bilirubin 0.3 - 1.2 mg/dL - - 2.0(H)  Alkaline Phos 38 - 126 U/L - - 214(H)  AST 15 - 41 U/L - - 58(H)  ALT 0 - 44 U/L - - 19    CBC Latest Ref Rng & Units 10/10/2020 10/09/2020 10/09/2020  WBC 4.0 - 10.5 K/uL 1.5(L) 2.3(L) 2.6(L)  Hemoglobin 13.0 - 17.0 g/dL 6.6(LL) 8.4(L) 8.6(L)  Hematocrit 39.0 - 52.0 % 20.0(L) 25.4(L) 25.7(L)  Platelets 150 - 400 K/uL 25(LL) 45(L) 48(L)    CBG (last 3)  Recent Labs    10/10/20 0631 10/10/20 0646 10/10/20 0716  GLUCAP 66* 102* 84   Signature:  Chesley Mires, MD [place] Pager - [phone] 10/10/2020, 7:58 AM

## 2020-10-10 NOTE — Progress Notes (Signed)
CXR reviewed after chest tube removal.  Very minimal Rt apical pneumothorax with progression.  I suspect this will gradually resolve on it's own.  Chesley Mires, MD [place] Pager - [phone] 10/10/2020, 3:15 PM

## 2020-10-10 NOTE — TOC Initial Note (Addendum)
Transition of Care [facility]) - Initial/Assessment Note    Patient Details  Name: Danny Mccall MRN: [id number] Date of Birth: 12/26/1945  Transition of Care [facility]) CM/SW Contact:    Geralynn Ochs, LCSW Phone Number: 10/10/2020, 4:18 PM  Clinical Narrative:    CSW alerted by palliative NP that plan is for patient to go home with hospice. CSW spoke with patient's wife, Malachy Mood, via phone to discuss. CSW explained hospice at home, as she thought that CSW was calling about a facility placement. Patient active with a service already, but wife did not remember the name. Wife called the grandson at the house and she called back, that patient has been active with [facility]. CSW contacted [facility] to ask if the patient was on their palliative service and could switch to hospice, awaiting call back. CSW to follow.              UPDATE: [facility] called back, they are able to switch the patient to hospice. They will assess for equipment needs, and could be ready for a discharge home tomorrow if patient is stable. CSW to follow.   Expected Discharge Plan: Home w Hospice Care Barriers to Discharge: Continued Medical Work up   Patient Goals and CMS Choice Patient states their goals for this hospitalization and ongoing recovery are:: to get home CMS Medicare.gov Compare Post Acute Care list provided to:: Patient Represenative (must comment) Choice offered to / list presented to : Spouse  Expected Discharge Plan and Services Expected Discharge Plan: [place] Acute Care Choice: Hospice Living arrangements for the past 2 months: Single Family Home                                      Prior Living Arrangements/Services Living arrangements for the past 2 months: Single Family Home Lives with:: Spouse Patient language and need for interpreter reviewed:: No Do you feel safe going back to the place where you live?: Yes      Need for Family  Participation in Patient Care: Yes (Comment) Care giver support system in place?: Yes (comment)   Criminal Activity/Legal Involvement Pertinent to Current Situation/Hospitalization: No - Comment as needed  Activities of Daily Living Home Assistive Devices/Equipment: None ADL Screening (condition at time of admission) Patient's cognitive ability adequate to safely complete daily activities?: Yes Is the patient deaf or have difficulty hearing?: No Does the patient have difficulty seeing, even when wearing glasses/contacts?: No Does the patient have difficulty concentrating, remembering, or making decisions?: No Patient able to express need for assistance with ADLs?: Yes Does the patient have difficulty dressing or bathing?: Yes Independently performs ADLs?: No Communication: Independent Dressing (OT): Needs assistance Is this a change from baseline?: Pre-admission baseline Grooming: Needs assistance Is this a change from baseline?: Pre-admission baseline Feeding: Needs assistance Is this a change from baseline?: Pre-admission baseline Bathing: Needs assistance Is this a change from baseline?: Pre-admission baseline Toileting: Needs assistance Is this a change from baseline?: Pre-admission baseline In/Out Bed: Needs assistance Is this a change from baseline?: Pre-admission baseline Walks in Home: Needs assistance Is this a change from baseline?: Pre-admission baseline Does the patient have difficulty walking or climbing stairs?: Yes Weakness of Legs: Both Weakness of Arms/Hands: Both  Permission Sought/Granted Permission sought to share information with : Facility Contact Representative,Family Supports Permission granted to share information with :  Yes, Verbal Permission Granted  Share Information with NAME: Malachy Mood  Permission granted to share info w AGENCY: [facility], [facility] granted to share info w Relationship: Spouse     Emotional Assessment    Attitude/Demeanor/Rapport: Unable to Assess Affect (typically observed): Unable to Assess Orientation: : Oriented to Self,Oriented to Place,Oriented to Situation      Admission diagnosis:  Shock ([place]) [R57.9] Prostate cancer ([place]) [C61] Pneumothorax [J93.9] Sepsis ([place]) [A41.9] Anemia, unspecified type [D64.9] Patient Active Problem List   Diagnosis Date Noted  . Spontaneous pneumothorax 10/10/2020  . Pressure injury of skin 10/09/2020  . Shock ([place]) 10/08/2020  . Melena 11/07/2017  . H. pylori duodenitis 11/07/2017  . Prostate cancer ([place]) 11/07/2017  . BPH (benign prostatic hyperplasia) 11/07/2017  . HLD (hyperlipidemia) 11/07/2017  . Heme positive stool   . Duodenum ulcer   . Anemia 11/04/2017  . HTN (hypertension) 07/30/2013  . Diabetes ([place]) 07/30/2013   PCP:  Bernerd Limbo, MD Pharmacy:   [facility] [place], [place] AT [address] [place] [address] Phone: [phone] Fax: [phone]     Social Determinants of Health (SDOH) Interventions    Readmission Risk Interventions No flowsheet data found.

## 2020-10-10 NOTE — Progress Notes (Addendum)
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   I met today with [name]. He is sitting up in bed. He is lethargic but awake and able to speak with me. I discussed with him beginning mild laxative to prevent constipation with decreased activity as well as increased opioid use and he agrees. He complains of pain mainly in RLE sounds like neuropathic pain. Discussed adjunctive pain medication such as gabapentin but he would rather continue with current oxycodone for management. We discussed management with heat pad and he does say this helps at home. He has no further complaints except for increasing pain in RLE and wanting to sit on side of bed. He confirms plan to return home as soon as possible and will have support at home from hospice per discussion yesterday. Of note [name] does not like the term hospice himself and will just say that he will have help at home. Goals appear to be clear at this time.   Exam: Alert, oriented. Cachectic. No distress. HR 90s. Breathing regular, unlabored. CT out and plan for follow up CXR to monitor need for replacement. Abd soft, flat. Generalized weakness.   Plan: - RLE pain "heavy, achy, tingling" pain: Does not wish to try adjunctive such as gabapentin. Continue with OxyIR as needed. Will also try heating pad.  - He is very lethargic and with chronic bone met pain. May benefit from decadron for pain relief and to combat lethargy from opioid use as long as this does not cause anxiety. May consider in the future. He does not seem very open to increasing medication burden at this time.  - Bowel Regimen: Agrees to senokot 1 tablet daily to prevent constipation with increased opioid needs and more bedbound status.  - Home with hospice once we know if chest tube  will be required or not required. Chest tube removed today and PCCM following serial PCXR to monitor and determine plan.   15 min  Vinie Sill, NP Palliative Medicine Team Pager [phone] (Please see [email or web address] for schedule) Team Phone [phone]    Greater than 50%  of this time was spent counseling and coordinating care related to the above assessment and plan

## 2020-10-11 ENCOUNTER — Inpatient Hospital Stay (HOSPITAL_COMMUNITY): Payer: Medicare HMO

## 2020-10-11 DIAGNOSIS — J9383 Other pneumothorax: Secondary | ICD-10-CM | POA: Diagnosis not present

## 2020-10-11 LAB — TYPE AND SCREEN
ABO/RH(D): A POS
Antibody Screen: NEGATIVE
Unit division: 0
Unit division: 0
Unit division: 0

## 2020-10-11 LAB — CBC WITH DIFFERENTIAL/PLATELET
Abs Immature Granulocytes: 0.14 10*3/uL — ABNORMAL HIGH (ref 0.00–0.07)
Basophils Absolute: 0 10*3/uL (ref 0.0–0.1)
Basophils Relative: 1 %
Eosinophils Absolute: 0 10*3/uL (ref 0.0–0.5)
Eosinophils Relative: 0 %
HCT: 22.5 % — ABNORMAL LOW (ref 39.0–52.0)
Hemoglobin: 7.3 g/dL — ABNORMAL LOW (ref 13.0–17.0)
Immature Granulocytes: 7 %
Lymphocytes Relative: 37 %
Lymphs Abs: 0.7 10*3/uL (ref 0.7–4.0)
MCH: 29.6 pg (ref 26.0–34.0)
MCHC: 32.4 g/dL (ref 30.0–36.0)
MCV: 91.1 fL (ref 80.0–100.0)
Monocytes Absolute: 0.3 10*3/uL (ref 0.1–1.0)
Monocytes Relative: 16 %
Neutro Abs: 0.7 10*3/uL — ABNORMAL LOW (ref 1.7–7.7)
Neutrophils Relative %: 39 %
Platelets: 20 10*3/uL — CL (ref 150–400)
RBC: 2.47 MIL/uL — ABNORMAL LOW (ref 4.22–5.81)
RDW: 18.4 % — ABNORMAL HIGH (ref 11.5–15.5)
WBC: 1.9 10*3/uL — ABNORMAL LOW (ref 4.0–10.5)
nRBC: 14.8 % — ABNORMAL HIGH (ref 0.0–0.2)

## 2020-10-11 LAB — GLUCOSE, CAPILLARY
Glucose-Capillary: 57 mg/dL — ABNORMAL LOW (ref 70–99)
Glucose-Capillary: 68 mg/dL — ABNORMAL LOW (ref 70–99)
Glucose-Capillary: 69 mg/dL — ABNORMAL LOW (ref 70–99)
Glucose-Capillary: 73 mg/dL (ref 70–99)
Glucose-Capillary: 80 mg/dL (ref 70–99)
Glucose-Capillary: 85 mg/dL (ref 70–99)
Glucose-Capillary: 86 mg/dL (ref 70–99)
Glucose-Capillary: 90 mg/dL (ref 70–99)
Glucose-Capillary: 95 mg/dL (ref 70–99)

## 2020-10-11 LAB — BASIC METABOLIC PANEL
Anion gap: 11 (ref 5–15)
BUN: 40 mg/dL — ABNORMAL HIGH (ref 8–23)
CO2: 19 mmol/L — ABNORMAL LOW (ref 22–32)
Calcium: 8.2 mg/dL — ABNORMAL LOW (ref 8.9–10.3)
Chloride: 105 mmol/L (ref 98–111)
Creatinine, Ser: 1.76 mg/dL — ABNORMAL HIGH (ref 0.61–1.24)
GFR, Estimated: 40 mL/min — ABNORMAL LOW (ref >60)
Glucose, Bld: 101 mg/dL — ABNORMAL HIGH (ref 70–99)
Potassium: 4.2 mmol/L (ref 3.5–5.1)
Sodium: 135 mmol/L (ref 135–145)

## 2020-10-11 LAB — BPAM RBC
Blood Product Expiration Date: 202202192359
Blood Product Expiration Date: 202202282359
Blood Product Expiration Date: 202202282359
ISSUE DATE / TIME: 202201282012
ISSUE DATE / TIME: 202201282012
ISSUE DATE / TIME: 202201300357
Unit Type and Rh: 6200
Unit Type and Rh: 6200
Unit Type and Rh: 6200

## 2020-10-11 LAB — MAGNESIUM: Magnesium: 2.1 mg/dL (ref 1.7–2.4)

## 2020-10-11 MED ORDER — DEXTROSE 50 % IV SOLN
12.5000 g | INTRAVENOUS | Status: AC
  Administered 2020-10-11: 12.5 g via INTRAVENOUS
  Filled 2020-10-11: qty 50

## 2020-10-11 MED ORDER — DEXTROSE 50 % IV SOLN
INTRAVENOUS | Status: AC
  Administered 2020-10-11: 50 mL
  Filled 2020-10-11: qty 50

## 2020-10-11 NOTE — Progress Notes (Signed)
Hypoglycemic Event  CBG: 68  Treatment: D50 25 mL (12.5 gm)  Symptoms: None  Follow-up CBG: CXKG:8185 CBG Result:95  Possible Reasons for Event: Unknown  Comments/MD notified:[facility] MD notified on rounds    Danny Mccall

## 2020-10-11 NOTE — TOC Progression Note (Signed)
Transition of Care [facility]) - Progression Note    Patient Details  Name: Danny Mccall MRN: [id number] Date of Birth: [birth year]  Transition [facility] CM/SW [place], [place] Phone Number: 10/11/2020, 3:35 PM  Clinical Narrative:   CSW received call from [facility] with [facility] this morning, that they could get the patient home today if he was ready, but they would need to talk to the wife for consenting. Marylin Crosby was unable to get the wife on the phone throughout the day, and called CSW back. CSW spoke with RN, and the wife had left her phone at the hospital and has not arrived yet today to get it. RN to ask the wife to call hospice as soon as she arrives to the hospital and gets her phone. Per Marylin Crosby, they can admit the patient home tomorrow as long as the wife is in agreement, and have a tentative plan to meet at the home at 6pm tomorrow. CSW to follow.    Expected Discharge Plan: Home w Hospice Care Barriers to Discharge: Continued Medical Work up  Expected Discharge Plan and Services Expected Discharge Plan: [place] Acute Care Choice: Hospice Living arrangements for the past 2 months: Single Family Home                                       Social Determinants of Health (SDOH) Interventions    Readmission Risk Interventions No flowsheet data found.

## 2020-10-11 NOTE — Progress Notes (Addendum)
Progress Note    Danny Mccall [place]  [id number] DOB: Jun 17, 1946  DOA: 10/08/2020 PCP: Bernerd Limbo, MD      Brief Narrative:    Medical records reviewed and are as summarized below:  Danny Mccall is a 75 y.o. male with hx of metastatic prostate cancer presented with weakness and altered mental status.  He was found to have anemia, thrombocytopenia, lactic acidosis, hypotension, and spontaneous pneumothorax.  A chest tube was placed in the right pleural space on 10/08/2020 and it was removed on 10/10/2020.  Repeat chest x-ray showed resolution of right-sided pneumothorax.  He was also transfused with 3 units of packed red blood cells for severe anemia and 1 unit of platelets for severe thrombocytopenia.  He was given IV fluids for hypotension and IV dextrose infusion for recurrent hypoglycemia.  Patient and his family have opted for comfort measures with hospice at home.      Assessment/Plan:   Principal Problem:   Spontaneous pneumothorax Active Problems:   Shock ([place])   Pressure injury of skin   Body mass index is 19.83 kg/m.     Spontaneous Rt pneumothorax. Chest tube was placed by ED physician on 10/08/2020 and removed on 10/10/2020. Repeat chest x-ray today did not show any pneumothorax.  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 Thrombocytopenia, leukopenia from bone marrow suppression. S/p transfusion with 1 unit of PRBCs on 10/10/2020. S/p transfusion with 2 units of PRBCs 10/08/2020. S/p transfusion 1 unit of platelets on 10/08/2020.  - followed at Dr. Alverda Skeans with [facility] by hematology/oncology - at time of last oncology visit on 09/01/20 he was advised that there were not further cancer therapies planned and advised to consider hospice - f/u CBC - transfuse for Hb < 7, PLT < 10K, or significant bleeding  Stage II coccygeal decubitus  ulcer -present on admission - wound care  Cancer related cachexia. - encourage oral intake  Hypoglycemia. -Continue IV dextrose/normal saline infusion  Hypotension BP is still low.  Continue IV fluids.  Plan to discharge home tomorrow with hospice.  According to the social worker, Education officer, museum, hospice team may be able to accept the patient tomorrow.    Pressure Injury 10/09/20 Coccyx Medial Stage 2 -  Partial thickness loss of dermis presenting as a shallow open injury with a red, pink wound bed without slough. (Active)  10/09/20 0220  Location: Coccyx  Location Orientation: Medial  Staging: Stage 2 -  Partial thickness loss of dermis presenting as a shallow open injury with a red, pink wound bed without slough.  Wound Description (Comments):   Present on Admission: Yes        Diet Order            Diet regular Room service appropriate? Yes; Fluid consistency: Thin  Diet effective now                    Consultants:  Intensivist  Procedures:  Chest tube placed on 10/08/2020 and removed on 10/10/2020    Medications:   . brimonidine  1 drop Both Eyes TID  . brinzolamide  1 drop Both Eyes TID  . Chlorhexidine Gluconate Cloth  6 each Topical Daily  . latanoprost  1 drop Both Eyes QHS  . senna  1 tablet Oral Daily   Continuous Infusions: . dextrose 5 % and 0.9% NaCl 75 mL/hr at 10/11/20 1200  Anti-infectives (From admission, onward)   Start     Dose/Rate Route Frequency Ordered Stop   10/09/20 0015  levofloxacin (LEVAQUIN) IVPB 750 mg  Status:  Discontinued        750 mg 100 mL/hr over 90 Minutes Intravenous Every 24 hours 10/09/20 0016 10/09/20 0925             Family Communication/Anticipated D/C date and plan/Code Status   DVT prophylaxis: SCDs Start: 10/08/20 2139     Code Status: Partial Code  Family Communication: None Disposition Plan:    Status is: Inpatient  Remains inpatient appropriate because:Inpatient level of  care appropriate due to severity of illness   Dispo: The patient is from: Home              Anticipated d/c is to: Home              Anticipated d/c date is: 1 day              Patient currently is not medically stable to d/c.   Difficult to place patient No           Subjective:   Interval events noted.  No new complaints.  Glucose and blood pressure have been running low.  Objective:    Vitals:   10/11/20 1400 10/11/20 1430 10/11/20 1500 10/11/20 1530  BP: (!) 96/55 (!) 83/62 (!) 84/43 103/70  Pulse:      Resp: (!) 6 17 13 15   Temp:      TempSrc:      SpO2:      Weight:      Height:       No data found.   Intake/Output Summary (Last 24 hours) at 10/11/2020 1607 Last data filed at 10/11/2020 1500 Gross per 24 hour  Intake 1756.75 ml  Output 200 ml  Net 1556.75 ml   Filed Weights   10/09/20 0309 10/10/20 0500 10/11/20 0445  Weight: 64.4 kg 64.6 kg 64.5 kg    Exam:  GEN: NAD SKIN: Stage II coccygeal decubitus ulcer EYES: EOMI ENT: MMM CV: RRR PULM: CTA B ABD: soft, ND, NT, +BS CNS: AAO x 3, non focal EXT: No edema or tenderness    Data Reviewed:   I have personally reviewed following labs and imaging studies:  Labs: Labs show the following:   Basic Metabolic Panel: Recent Labs  Lab 10/08/20 1817 10/09/20 0206 10/10/20 0117 10/11/20 0622  NA 136 136 135 135  K 4.2 4.3 4.3 4.2  CL 105 103 108 105  CO2 16* 20* 16* 19*  GLUCOSE 103* 109* 179* 101*  BUN 27* 27* 31* 40*  CREATININE 1.14 1.02 1.22 1.76*  CALCIUM 8.0* 7.9* 7.7* 8.2*  MG  --  1.6* 1.9 2.1  PHOS  --  5.0*  --   --    GFR Estimated Creatinine Clearance: 34.1 mL/min (A) (by C-G formula based on SCr of 1.76 mg/dL (H)). Liver Function Tests: Recent Labs  Lab 10/08/20 1817  AST 58*  ALT 19  ALKPHOS 214*  BILITOT 2.0*  PROT 4.5*  ALBUMIN 2.0*   No results for input(s): LIPASE, AMYLASE in the last 168 hours. No results for input(s): AMMONIA in the last 168  hours. Coagulation profile Recent Labs  Lab 10/08/20 1817  INR 1.6*    CBC: Recent Labs  Lab 10/08/20 1817 10/09/20 0206 10/09/20 0305 10/10/20 0117 10/11/20 0622  WBC 3.4* 2.6* 2.3* 1.5* 1.9*  NEUTROABS 1.2*  --   --   --  0.7*  HGB 5.6* 8.6* 8.4* 6.6* 7.3*  HCT 17.6* 25.7* 25.4* 20.0* 22.5*  MCV 94.6 91.8 91.7 93.5 91.1  PLT 8* 48* 45* 25* 20*   Cardiac Enzymes: No results for input(s): CKTOTAL, CKMB, CKMBINDEX, TROPONINI in the last 168 hours. BNP (last 3 results) No results for input(s): PROBNP in the last 8760 hours. CBG: Recent Labs  Lab 10/11/20 0739 10/11/20 0822 10/11/20 1200 10/11/20 1525 10/11/20 1604  GLUCAP 68* 95 80 69* 85   D-Dimer: No results for input(s): DDIMER in the last 72 hours. Hgb A1c: No results for input(s): HGBA1C in the last 72 hours. Lipid Profile: No results for input(s): CHOL, HDL, LDLCALC, TRIG, CHOLHDL, LDLDIRECT in the last 72 hours. Thyroid function studies: No results for input(s): TSH, T4TOTAL, T3FREE, THYROIDAB in the last 72 hours.  Invalid input(s): FREET3 Anemia work up: No results for input(s): VITAMINB12, FOLATE, FERRITIN, TIBC, IRON, RETICCTPCT in the last 72 hours. Sepsis Labs: Recent Labs  Lab 10/08/20 1817 10/09/20 0206 10/09/20 0305 10/10/20 0117 10/11/20 0622  WBC 3.4* 2.6* 2.3* 1.5* 1.9*  LATICACIDVEN 8.1*  --  1.9 2.0*  --     Microbiology Recent Results (from the past 240 hour(s))  SARS CORONAVIRUS 2 (TAT 6-24 HRS) Nasopharyngeal Nasopharyngeal Swab     Status: None   Collection Time: 10/08/20  6:29 PM   Specimen: Nasopharyngeal Swab  Result Value Ref Range Status   SARS Coronavirus 2 NEGATIVE NEGATIVE Final    Comment: (NOTE) SARS-CoV-2 target nucleic acids are NOT DETECTED.  The SARS-CoV-2 RNA is generally detectable in upper and lower respiratory specimens during the acute phase of infection. Negative results do not preclude SARS-CoV-2 infection, do not rule out co-infections with other  pathogens, and should not be used as the sole basis for treatment or other patient management decisions. Negative results must be combined with clinical observations, patient history, and epidemiological information. The expected result is Negative.  Fact Sheet for Patients: SugarRoll.be  Fact Sheet for Healthcare Providers: https://www.woods-mathews.com/  This test is not yet approved or cleared by the [place] FDA and  has been authorized for detection and/or diagnosis of SARS-CoV-2 by FDA under an Emergency Use Authorization (EUA). This EUA will remain  in effect (meaning this test can be used) for the duration of the COVID-19 declaration under Se ction 564(b)(1) of the Act, 21 U.S.C. section 360bbb-3(b)(1), unless the authorization is terminated or revoked sooner.  Performed at [facility], [place] [address] [place], [place] [address]   Blood culture (routine single)     Status: None (Preliminary result)   Collection Time: 10/08/20  9:23 PM   Specimen: BLOOD LEFT ARM  Result Value Ref Range Status   Specimen Description BLOOD LEFT ARM  Final   Special Requests   Final    BOTTLES DRAWN AEROBIC AND ANAEROBIC Blood Culture adequate volume   Culture   Final    NO GROWTH 3 DAYS Performed at [facility] [address], [place], [place] [address]    Report Status PENDING  Incomplete  MRSA PCR Screening     Status: None   Collection Time: 10/09/20  2:23 AM   Specimen: Nasal Mucosa; Nasopharyngeal  Result Value Ref Range Status   MRSA by PCR NEGATIVE NEGATIVE Final    Comment:        The GeneXpert MRSA Assay (FDA approved for NASAL specimens only), is one component of a comprehensive MRSA colonization surveillance program. It is not intended to  diagnose MRSA infection nor to guide or monitor treatment for MRSA infections. Performed at [facility], [place] [address] [place], [place] [address]   Culture, blood  (routine x 2)     Status: None (Preliminary result)   Collection Time: 10/09/20  3:07 AM   Specimen: BLOOD  Result Value Ref Range Status   Specimen Description BLOOD LEFT ANTECUBITAL  Final   Special Requests   Final    BOTTLES DRAWN AEROBIC AND ANAEROBIC Blood Culture adequate volume   Culture   Final    NO GROWTH 2 DAYS Performed at [facility], [place] [address], [place], [place] [address]    Report Status PENDING  Incomplete  Culture, blood (routine x 2)     Status: None (Preliminary result)   Collection Time: 10/09/20  3:12 AM   Specimen: BLOOD  Result Value Ref Range Status   Specimen Description BLOOD RIGHT ANTECUBITAL  Final   Special Requests   Final    BOTTLES DRAWN AEROBIC ONLY Blood Culture results may not be optimal due to an inadequate volume of blood received in culture bottles   Culture   Final    NO GROWTH 2 DAYS Performed at [facility], [place] [address] [place], [place] [address]    Report Status PENDING  Incomplete  Urine culture     Status: Abnormal   Collection Time: 10/09/20  5:24 AM   Specimen: In/Out Cath Urine  Result Value Ref Range Status   Specimen Description IN/OUT CATH URINE  Final   Special Requests NONE  Final   Culture (A)  Final    >=100,000 COLONIES/mL STREPTOCOCCUS AGALACTIAE TESTING AGAINST S. AGALACTIAE NOT ROUTINELY PERFORMED DUE TO PREDICTABILITY OF AMP/PEN/VAN SUSCEPTIBILITY. Performed at [facility], [place] [address], [place], [place] [address]    Report Status 10/10/2020 FINAL  Final    Procedures and diagnostic studies:  DG Chest Port 1 View  Result Date: 10/11/2020 CLINICAL DATA:  Pleural effusion EXAM: PORTABLE CHEST 1 VIEW COMPARISON:  Yesterday FINDINGS: Normal heart size and mediastinal contours. Continued hazy left chest and indistinct left diaphragm from atelectasis and pleural fluid by CT. Widespread osseous metastatic disease. IMPRESSION: Unchanged hazy left chest opacity from atelectasis and pleural  fluid. Electronically Signed   By: Monte Fantasia M.D.   On: 10/11/2020 05:12   DG Chest Port 1 View  Result Date: 10/10/2020 CLINICAL DATA:  Chest tube removal EXAM: PORTABLE CHEST 1 VIEW COMPARISON:  10/10/2020 FINDINGS: Right basilar chest tube removed. Probable minimal right apical pneumothorax. No pleural effusion. Left lower lobe atelectasis/infiltrate unchanged. Diffuse osseous metastatic disease unchanged. IMPRESSION: Right chest tube removed.  Minimal right apical pneumothorax. No change left lower lobe airspace disease. Electronically Signed   By: Franchot Gallo M.D.   On: 10/10/2020 14:25   DG Chest Port 1 View  Result Date: 10/10/2020 CLINICAL DATA:  Pneumothorax on the right EXAM: PORTABLE CHEST 1 VIEW COMPARISON:  Yesterday FINDINGS: Chest tube at the right base.  Trace right apical pneumothorax. Hazy density of the left more than right chest, atelectasis and pleural fluid based on recent chest CT. Normal heart size. Confluent osseous metastatic disease. IMPRESSION: 1. Trace right apical pneumothorax. 2. Hazy density of the left chest from atelectasis and pleural fluid by recent chest CT. Electronically Signed   By: Monte Fantasia M.D.   On: 10/10/2020 05:41               LOS: 3 days  Leva Baine  [facility] Copywriter, advertising on [email or web address]. If 7PM-7AM, please contact night-coverage at [email or web address]     10/11/2020, 4:07 PM

## 2020-10-12 DIAGNOSIS — C61 Malignant neoplasm of prostate: Secondary | ICD-10-CM | POA: Diagnosis not present

## 2020-10-12 DIAGNOSIS — L89302 Pressure ulcer of unspecified buttock, stage 2: Secondary | ICD-10-CM | POA: Diagnosis not present

## 2020-10-12 DIAGNOSIS — D638 Anemia in other chronic diseases classified elsewhere: Secondary | ICD-10-CM

## 2020-10-12 DIAGNOSIS — J9383 Other pneumothorax: Secondary | ICD-10-CM | POA: Diagnosis not present

## 2020-10-12 DIAGNOSIS — I959 Hypotension, unspecified: Secondary | ICD-10-CM | POA: Diagnosis not present

## 2020-10-12 LAB — GLUCOSE, CAPILLARY
Glucose-Capillary: 102 mg/dL — ABNORMAL HIGH (ref 70–99)
Glucose-Capillary: 110 mg/dL — ABNORMAL HIGH (ref 70–99)
Glucose-Capillary: 113 mg/dL — ABNORMAL HIGH (ref 70–99)
Glucose-Capillary: 54 mg/dL — ABNORMAL LOW (ref 70–99)
Glucose-Capillary: 63 mg/dL — ABNORMAL LOW (ref 70–99)
Glucose-Capillary: 67 mg/dL — ABNORMAL LOW (ref 70–99)
Glucose-Capillary: 75 mg/dL (ref 70–99)
Glucose-Capillary: 86 mg/dL (ref 70–99)

## 2020-10-12 LAB — PATHOLOGIST SMEAR REVIEW

## 2020-10-12 MED ORDER — DEXTROSE 50 % IV SOLN
12.5000 g | INTRAVENOUS | Status: AC
  Filled 2020-10-12: qty 50

## 2020-10-12 MED ORDER — MIDODRINE HCL 5 MG PO TABS
5.0000 mg | ORAL_TABLET | Freq: Three times a day (TID) | ORAL | Status: DC
  Administered 2020-10-12 – 2020-10-13 (×3): 5 mg via ORAL
  Filled 2020-10-12 (×4): qty 1

## 2020-10-12 MED ORDER — DEXTROSE 50 % IV SOLN
25.0000 g | INTRAVENOUS | Status: AC
  Administered 2020-10-12: 25 g via INTRAVENOUS
  Filled 2020-10-12: qty 50

## 2020-10-12 MED ORDER — DEXTROSE 50 % IV SOLN
12.5000 g | INTRAVENOUS | Status: AC
  Administered 2020-10-12: 12.5 g via INTRAVENOUS
  Filled 2020-10-12: qty 50

## 2020-10-12 NOTE — Progress Notes (Signed)
HOSPITAL MEDICINE OVERNIGHT EVENT NOTE    Notified by nursing that patient's blood pressures are quite low, last recorded blood pressure 86/44.  Per nursing, clinically the patient has not exhibited any change neurologically.  Patient is arousable and at baseline mentation.  Patient is not in any distress  Chart reviewed, patient's current blood pressure of 86/44 is more or less in line with previous blood pressures in the past several days.  Considering patient's overall plan of care, I have recommended conservative management and continued close clinical monitoring of blood pressures.  If patient exhibits a clinical change such as significant lethargy, loss of consciousness or other signs of distress then nursing will notify me immediately.  Vernelle Emerald  MD [facility]

## 2020-10-12 NOTE — Progress Notes (Signed)
Glucose recheck 113. Will continue to monitor.

## 2020-10-12 NOTE — Progress Notes (Signed)
Glucose 63. Patient given D50 12.5g per prn standing order. Will continue to monitor.

## 2020-10-12 NOTE — TOC Progression Note (Addendum)
Transition of Care [facility]) - Progression Note    Patient Details  Name: Danny Mccall MRN: [id number] Date of Birth: February 19, 1946  Transition of Care [facility]) CM/SW Contact  Danny Collet, RN Phone Number: 10/12/2020, 11:48 AM  Clinical Narrative:   Tried to reach patient's wife to discuss home hospice discharge. Could not reach her or leave VM. Called alternative cell number listed, it was answered by the patient's son, in the room, who states he has not been able to reach his mother, and was not sure of her plans to return to the hospital today. He said she had returned last nigh to get her phone and his best guess was that she was charging it.  As of this morning, the Home Hospice liaison with [facility] has not been able to reach the wife either.  Sherre Scarlet: [phone]  13:45 Received call back from wife. Patient will need hospital bed through home hospice. Notified Leanne via text and VM to call wife, as she is now answering phone.  Wife states that she room ready for the bed. Once it is delivered and set up, patient will be ready for DC. Will need [facility], address verified with wife.   15:15 Spoke w [employer]. Plan will be for patient to get bed delivered at the home tonight/ tomorrow AM, and be ready for intake RN at the house at [place] transportation services verified that they will send non emergency ambulance for transportation home at noon Wednesday.    Contact Information  [phone] (Home Phone)  [phone] (Mobile)   Alternate Contact Person   Coble,Cheryl (Spouse)  [phone] (Mobile)       Expected Discharge Plan: Home w Hospice Care Barriers to Discharge: Continued Medical Work up  Expected Discharge Plan and Services Expected Discharge Plan: [place] Acute Care Choice: Hospice Living arrangements for the past 2 months: Single Family Home                                       Social Determinants  of Health (SDOH) Interventions    Readmission Risk Interventions No flowsheet data found.

## 2020-10-12 NOTE — Progress Notes (Signed)
PROGRESS NOTE  Danny Mccall [place] RCB:[id number] DOB: 07/25/1946   PCP: Tracey Harries, MD  Patient is from: Home  DOA: 10/08/2020 LOS: 4  Chief complaints: Altered mental status and weakness  Brief Narrative / Interim history: 75 year old male with history of metastatic prostate cancer presented with weakness and altered mental status and found to have anemia, thrombocytopenia, hypotension and a spontaneous pneumothorax.  Chest tube to right chest 1/28-1/30.  Repeat CXR showed resolution of sided pneumothorax.  Anemia and thrombocytopenia thought to be bone marrow suppression in the setting of metastatic prostate cancer.  Received 3 units of PRBC and 1 unit of platelets.  Received IVF for hypotension and IV dextrose for hypoglycemia.  Eventually, patient and his family opted for comfort measures with home hospice.  Subjective: Seen and examined earlier this morning.  He had soft blood pressures overnight but not symptomatic.  He was not tachycardic either.  He is awake but not a great historian.  He is oriented to self and partial place.  He says he likes to sit up.  Objective: Vitals:   10/12/20 0800 10/12/20 0900 10/12/20 1000 10/12/20 1100  BP: (!) 95/50 (!) 94/42 (!) 86/53 (!) 83/45  Pulse: 81 82 81 79  Resp: (!) 9 16 15 12   Temp:      TempSrc:      SpO2: 100% 100%    Weight:      Height:        Intake/Output Summary (Last 24 hours) at 10/12/2020 1201 Last data filed at 10/12/2020 1100 Gross per 24 hour  Intake 1848.04 ml  Output 80 ml  Net 1768.04 ml   Filed Weights   10/09/20 0309 10/10/20 0500 10/11/20 0445  Weight: 64.4 kg 64.6 kg 64.5 kg    Examination:  GENERAL: Frail and chronically ill-appearing. HEENT: MMM.  Vision and hearing grossly intact.  Temporal wasting NECK: Supple.  No apparent JVD.  RESP:  No IWOB.  Fair aeration bilaterally. CVS:  RRR. Heart sounds normal.  ABD/GI/GU: BS+. Abd soft, NTND.  MSK/EXT: Dependent edema in 4 extremities.   Significant muscle mass and subcu fat loss. SKIN: no apparent skin lesion or wound NEURO: Awake and alert.  Oriented to self and partially to place.  Follows some commands.  No apparent focal neuro deficit but very weak PSYCH: Calm.  No distress or agitation.  Procedures:  Right chest tube 1/28-1/30  Microbiology summarized: COVID-19 PCR negative. Blood cultures NGTD. Urine culture with Streptococcus agalactiae.  Assessment & Plan: Spontaneous pneumothorax: Resolved with chest tube from 1/28-1/30.  No respiratory issues.  On room air.  Anemia of chronic disease/thrombocytopenia: Thought to be due to bone marrow suppression in the setting of metastatic prostate cancer.  Transfused 3 units of PRBC and 1 unit of platelets so far.  Metastatic prostate cancer: followed at Dr. Joycie Peek with [facility] by hematology/oncology. At time of last oncology visit on 09/01/20 he was advised that there were not further cancer therapies planned and advised to consider hospice  Stage II coccygeal decubitus ulcer: POA -wound care  Hypoglycemia. -Continue IV dextrose/normal saline infusion  Hypotension: Remains hypotensive but not tachycardic or symptomatic. -Start midodrine -Continue IV fluid  Severe malnutrition/cancer related cachexia Body mass index is 19.83 kg/m.       Stage II coccygeal pressure injury: POA   Pressure Injury 10/09/20 Coccyx Medial Stage 2 -  Partial thickness loss of dermis presenting as a shallow open injury with a red, pink wound bed without slough. (Active)  10/09/20  0220  Location: Coccyx  Location Orientation: Medial  Staging: Stage 2 -  Partial thickness loss of dermis presenting as a shallow open injury with a red, pink wound bed without slough.  Wound Description (Comments):   Present on Admission: Yes   DVT prophylaxis:  SCDs Start: 10/08/20 2139  Code Status: DNR/DNI but okay with NIPPV. Family Communication: Patient and/or RN. Available if any  question. Level of care: Med-Surg Status is: Inpatient  Remains inpatient appropriate because:Unsafe d/c plan   Dispo: The patient is from: Home              Anticipated d/c is to: Home with home hospice              Anticipated d/c date is: 1 day              Patient currently is medically stable to d/c.   Difficult to place patient Yes       Consultants:  PCCM   Sch Meds:  Scheduled Meds: . brimonidine  1 drop Both Eyes TID  . brinzolamide  1 drop Both Eyes TID  . Chlorhexidine Gluconate Cloth  6 each Topical Daily  . latanoprost  1 drop Both Eyes QHS  . senna  1 tablet Oral Daily   Continuous Infusions: . dextrose 5 % and 0.9% NaCl 75 mL/hr at 10/12/20 1100   PRN Meds:.docusate sodium, HYDROmorphone (DILAUDID) injection, ondansetron, oxyCODONE, polyethylene glycol  Antimicrobials: Anti-infectives (From admission, onward)   Start     Dose/Rate Route Frequency Ordered Stop   10/09/20 0015  levofloxacin (LEVAQUIN) IVPB 750 mg  Status:  Discontinued        750 mg 100 mL/hr over 90 Minutes Intravenous Every 24 hours 10/09/20 0016 10/09/20 0925       I have personally reviewed the following labs and images: CBC: Recent Labs  Lab 10/08/20 1817 10/09/20 0206 10/09/20 0305 10/10/20 0117 10/11/20 0622  WBC 3.4* 2.6* 2.3* 1.5* 1.9*  NEUTROABS 1.2*  --   --   --  0.7*  HGB 5.6* 8.6* 8.4* 6.6* 7.3*  HCT 17.6* 25.7* 25.4* 20.0* 22.5*  MCV 94.6 91.8 91.7 93.5 91.1  PLT 8* 48* 45* 25* 20*   BMP &GFR Recent Labs  Lab 10/08/20 1817 10/09/20 0206 10/10/20 0117 10/11/20 0622  NA 136 136 135 135  K 4.2 4.3 4.3 4.2  CL 105 103 108 105  CO2 16* 20* 16* 19*  GLUCOSE 103* 109* 179* 101*  BUN 27* 27* 31* 40*  CREATININE 1.14 1.02 1.22 1.76*  CALCIUM 8.0* 7.9* 7.7* 8.2*  MG  --  1.6* 1.9 2.1  PHOS  --  5.0*  --   --    Estimated Creatinine Clearance: 34.1 mL/min (A) (by C-G formula based on SCr of 1.76 mg/dL (H)). Liver & Pancreas: Recent Labs  Lab  10/08/20 1817  AST 58*  ALT 19  ALKPHOS 214*  BILITOT 2.0*  PROT 4.5*  ALBUMIN 2.0*   No results for input(s): LIPASE, AMYLASE in the last 168 hours. No results for input(s): AMMONIA in the last 168 hours. Diabetic: No results for input(s): HGBA1C in the last 72 hours. Recent Labs  Lab 10/11/20 1941 10/11/20 2334 10/12/20 0338 10/12/20 0440 10/12/20 0712  GLUCAP 86 73 67* 102* 86   Cardiac Enzymes: No results for input(s): CKTOTAL, CKMB, CKMBINDEX, TROPONINI in the last 168 hours. No results for input(s): PROBNP in the last 8760 hours. Coagulation Profile: Recent Labs  Lab 10/08/20 1817  INR 1.6*   Thyroid Function Tests: No results for input(s): TSH, T4TOTAL, FREET4, T3FREE, THYROIDAB in the last 72 hours. Lipid Profile: No results for input(s): CHOL, HDL, LDLCALC, TRIG, CHOLHDL, LDLDIRECT in the last 72 hours. Anemia Panel: No results for input(s): VITAMINB12, FOLATE, FERRITIN, TIBC, IRON, RETICCTPCT in the last 72 hours. Urine analysis:    Component Value Date/Time   COLORURINE AMBER (A) 10/09/2020 0524   APPEARANCEUR HAZY (A) 10/09/2020 0524   LABSPEC 1.028 10/09/2020 0524   PHURINE 5.0 10/09/2020 0524   GLUCOSEU NEGATIVE 10/09/2020 0524   HGBUR NEGATIVE 10/09/2020 0524   BILIRUBINUR NEGATIVE 10/09/2020 [place] 10/09/2020 0524   PROTEINUR NEGATIVE 10/09/2020 0524   UROBILINOGEN 0.2 01/03/2017 1742   NITRITE NEGATIVE 10/09/2020 0524   LEUKOCYTESUR NEGATIVE 10/09/2020 0524   Sepsis Labs: Invalid input(s): PROCALCITONIN, [place]  Microbiology: Recent Results (from the past 240 hour(s))  SARS CORONAVIRUS 2 (TAT 6-24 HRS) Nasopharyngeal Nasopharyngeal Swab     Status: None   Collection Time: 10/08/20  6:29 PM   Specimen: Nasopharyngeal Swab  Result Value Ref Range Status   SARS Coronavirus 2 NEGATIVE NEGATIVE Final    Comment: (NOTE) SARS-CoV-2 target nucleic acids are NOT DETECTED.  The SARS-CoV-2 RNA is generally detectable in  upper and lower respiratory specimens during the acute phase of infection. Negative results do not preclude SARS-CoV-2 infection, do not rule out co-infections with other pathogens, and should not be used as the sole basis for treatment or other patient management decisions. Negative results must be combined with clinical observations, patient history, and epidemiological information. The expected result is Negative.  Fact Sheet for Patients: SugarRoll.be  Fact Sheet for Healthcare Providers: https://www.woods-mathews.com/  This test is not yet approved or cleared by the [place] FDA and  has been authorized for detection and/or diagnosis of SARS-CoV-2 by FDA under an Emergency Use Authorization (EUA). This EUA will remain  in effect (meaning this test can be used) for the duration of the COVID-19 declaration under Se ction 564(b)(1) of the Act, 21 U.S.C. section 360bbb-3(b)(1), unless the authorization is terminated or revoked sooner.  Performed at [facility], [place] [address], [place], [place] [address]   Blood culture (routine single)     Status: None (Preliminary result)   Collection Time: 10/08/20  9:23 PM   Specimen: BLOOD LEFT ARM  Result Value Ref Range Status   Specimen Description BLOOD LEFT ARM  Final   Special Requests   Final    BOTTLES DRAWN AEROBIC AND ANAEROBIC Blood Culture adequate volume   Culture   Final    NO GROWTH 3 DAYS Performed at [facility] [address] [place], [place] [address]    Report Status PENDING  Incomplete  MRSA PCR Screening     Status: None   Collection Time: 10/09/20  2:23 AM   Specimen: Nasal Mucosa; Nasopharyngeal  Result Value Ref Range Status   MRSA by PCR NEGATIVE NEGATIVE Final    Comment:        The GeneXpert MRSA Assay (FDA approved for NASAL specimens only), is one component of a comprehensive MRSA colonization surveillance program. It is not intended to  diagnose MRSA infection nor to guide or monitor treatment for MRSA infections. Performed at [facility], [place] [address] [place], [place] [address]   Culture, blood (routine x 2)     Status: None (Preliminary result)   Collection Time: 10/09/20  3:07 AM   Specimen: BLOOD  Result Value Ref Range Status   Specimen Description BLOOD LEFT ANTECUBITAL  Final   Special Requests   Final    BOTTLES DRAWN AEROBIC AND ANAEROBIC Blood Culture adequate volume   Culture   Final    NO GROWTH 2 DAYS Performed at [facility], [address], [place], [place] [address]    Report Status PENDING  Incomplete  Culture, blood (routine x 2)     Status: None (Preliminary result)   Collection Time: 10/09/20  3:12 AM   Specimen: BLOOD  Result Value Ref Range Status   Specimen Description BLOOD RIGHT ANTECUBITAL  Final   Special Requests   Final    BOTTLES DRAWN AEROBIC ONLY Blood Culture results may not be optimal due to an inadequate volume of blood received in culture bottles   Culture   Final    NO GROWTH 2 DAYS Performed at [facility], [place] [address], [place], [place] [address]    Report Status PENDING  Incomplete  Urine culture     Status: Abnormal   Collection Time: 10/09/20  5:24 AM   Specimen: In/Out Cath Urine  Result Value Ref Range Status   Specimen Description IN/OUT CATH URINE  Final   Special Requests NONE  Final   Culture (A)  Final    >=100,000 COLONIES/mL STREPTOCOCCUS AGALACTIAE TESTING AGAINST S. AGALACTIAE NOT ROUTINELY PERFORMED DUE TO PREDICTABILITY OF AMP/PEN/VAN SUSCEPTIBILITY. Performed at [facility], [place] [address] [place], [place] [address]    Report Status 10/10/2020 FINAL  Final    Radiology Studies: No results found.    Iwao Shamblin T. Emmons  If 7PM-7AM, please contact night-coverage [email or web address] 10/12/2020, 12:01 PM

## 2020-10-12 NOTE — Progress Notes (Signed)
Spoke with Dr Cyd Silence regarding patients BP. BP 80/50. Patient mentation  at his baseline.    No orders per MD regarding BP. Continue to monitor patient as he  is mentating at his baseline. Will continue to monitor.

## 2020-10-13 DIAGNOSIS — D696 Thrombocytopenia, unspecified: Secondary | ICD-10-CM

## 2020-10-13 DIAGNOSIS — J9383 Other pneumothorax: Secondary | ICD-10-CM | POA: Diagnosis not present

## 2020-10-13 DIAGNOSIS — E162 Hypoglycemia, unspecified: Secondary | ICD-10-CM

## 2020-10-13 DIAGNOSIS — E43 Unspecified severe protein-calorie malnutrition: Secondary | ICD-10-CM | POA: Diagnosis not present

## 2020-10-13 DIAGNOSIS — Z66 Do not resuscitate: Secondary | ICD-10-CM

## 2020-10-13 DIAGNOSIS — I959 Hypotension, unspecified: Secondary | ICD-10-CM | POA: Diagnosis not present

## 2020-10-13 LAB — GLUCOSE, CAPILLARY
Glucose-Capillary: 68 mg/dL — ABNORMAL LOW (ref 70–99)
Glucose-Capillary: 116 mg/dL — ABNORMAL HIGH (ref 70–99)
Glucose-Capillary: 69 mg/dL — ABNORMAL LOW (ref 70–99)
Glucose-Capillary: 69 mg/dL — ABNORMAL LOW (ref 70–99)
Glucose-Capillary: 97 mg/dL (ref 70–99)
Glucose-Capillary: 85 mg/dL (ref 70–99)
Glucose-Capillary: 68 mg/dL — ABNORMAL LOW (ref 70–99)
Glucose-Capillary: 104 mg/dL — ABNORMAL HIGH (ref 70–99)
Glucose-Capillary: 67 mg/dL — ABNORMAL LOW (ref 70–99)

## 2020-10-13 LAB — CULTURE, BLOOD (SINGLE)
Culture: NO GROWTH
Special Requests: ADEQUATE

## 2020-10-13 MED ORDER — DEXTROSE 50 % IV SOLN
12.5000 g | INTRAVENOUS | Status: AC
  Administered 2020-10-13: 12.5 g via INTRAVENOUS
  Filled 2020-10-13: qty 50

## 2020-10-13 MED ORDER — DEXTROSE 50 % IV SOLN
INTRAVENOUS | Status: AC
  Administered 2020-10-13: 50 mL
  Filled 2020-10-13: qty 50

## 2020-10-13 MED ORDER — ONDANSETRON 4 MG PO TBDP: 4.0000 mg | ORAL_TABLET | Freq: Three times a day (TID) | ORAL | 0 refills | Status: AC | PRN

## 2020-10-13 MED ORDER — LORAZEPAM 0.5 MG PO TABS: 0.5000 mg | ORAL_TABLET | ORAL | 0 refills | Status: AC | PRN

## 2020-10-13 MED ORDER — MIDODRINE HCL 5 MG PO TABS: 5.0000 mg | ORAL_TABLET | Freq: Three times a day (TID) | ORAL | 0 refills | Status: AC

## 2020-10-13 MED ORDER — MORPHINE SULFATE (CONCENTRATE) 10 MG /0.5 ML PO SOLN: 10.0000 mg | ORAL | 0 refills | Status: AC | PRN

## 2020-10-13 NOTE — TOC Transition Note (Signed)
Transition of Care [facility]) - CM/SW Discharge Note   Patient Details  Name: Danny Mccall MRN: [id number] Date of Birth: Nov 15, 1945  Transition of Care [facility]) CM/SW Contact:  Carles Collet, RN Phone Number: 10/13/2020, 12:07 PM   Clinical Narrative:    Hospital bed delivery delayed by [facility]. Transportation could not be accommodated by [employer] and had to be deferred to [facility] who could reschedule for 2:30pm. [place] liaison Joslyn Devon now states that the next available admission nurse appointment to see the patient will be Friday. CM discussed this with attending, who was comfortable with DC today.        Barriers to Discharge: Continued Medical Work up   Patient Goals and CMS Choice Patient states their goals for this hospitalization and ongoing recovery are:: to get home CMS Medicare.gov Compare Post Acute Care list provided to:: Patient Represenative (must comment) Choice offered to / list presented to : Spouse  Discharge Placement                       Discharge Plan and Services     Post Acute Care Choice: Hospice                               Social Determinants of Health (SDOH) Interventions     Readmission Risk Interventions No flowsheet data found.

## 2020-10-13 NOTE — Discharge Summary (Signed)
Physician Discharge Summary  Danny Mccall [place] XMI:[id number] DOB: 1946/08/17 DOA: 10/08/2020  PCP: Bernerd Limbo, MD  Admit date: 10/08/2020 Discharge date: 10/13/2020  Admitted From: Home Disposition: Home with home hospice   Home Health: Home hospice Equipment/Devices: Hospice to deliver hospital bed  Discharge Condition: Stable but grim prognosis CODE STATUS: DNR/DNI   Hospital Course: 75 year old male with history of metastatic prostate cancer presented with weakness and altered mental status and found to have anemia, thrombocytopenia, hypotension and a spontaneous pneumothorax.  Chest tube to right chest 1/28-1/30.  Repeat CXR showed resolution of sided pneumothorax.  Anemia and thrombocytopenia thought to be bone marrow suppression in the setting of metastatic prostate cancer.  Received 3 units of PRBC and 1 unit of platelets.  Received IVF for hypotension and IV dextrose for hypoglycemia.  Eventually, patient and his family opted for comfort measures with home hospice.   See individual problem list below for more on hospital course.  Discharge Diagnoses:  Goal of care/DNR/DNI-family chose emphasis on comfort care given grim prognosis, which is appropriate. -Patient to be discharged with home hospice  -Rx for comfort meds including Roxanol, Ativan and Zofran.  Spontaneous pneumothorax: Resolved with chest tube from 1/28-1/30.  No respiratory issues.  On room air. -Currently full comfort care  Anemia of chronic disease/thrombocytopenia: Thought to be due to bone marrow suppression in the setting of metastatic prostate cancer.  Transfused 3 units of PRBC and 1 unit of platelets so far. -Currently full comfort care  Metastatic prostate cancer: followed at Dr. Alverda Skeans with [facility] by hematology/oncology. At time of last oncology visit on 09/01/20 he was advised that there were not further cancer therapies planned and advised to consider hospice  Stage II  coccygeal decubitus ulcer: POA -Recommend frequent turning and foam dressing  Hypoglycemia. -Comfort feed  Hypotension: Remains hypotensive but not tachycardic or symptomatic. -Discharged on midodrine  Severe malnutrition/cancer related cachexia Body mass index is 21.25 kg/m.         Pressure Injury 10/09/20 Coccyx Medial Stage 2 -  Partial thickness loss of dermis presenting as a shallow open injury with a red, pink wound bed without slough. (Active)  10/09/20 0220  Location: Coccyx  Location Orientation: Medial  Staging: Stage 2 -  Partial thickness loss of dermis presenting as a shallow open injury with a red, pink wound bed without slough.  Wound Description (Comments):   Present on Admission: Yes    Discharge Exam: Vitals:   10/13/20 0800 10/13/20 1100  BP: (!) 114/35   Pulse: 95   Resp: 19   Temp:  97.6 F (36.4 C)  SpO2: 100%     GENERAL: Frail looking elderly male.  No apparent distress. HEENT: MMM.  Vision and hearing grossly intact.  Significant temporal wasting. NECK: Supple.  No apparent JVD.  RESP: On room air.  No IWOB.  Fair aeration bilaterally. CVS:  RRR. Heart sounds normal.  ABD/GI/GU: Bowel sounds present. Soft. Non tender.  MSK/EXT:  Moves extremities. No apparent deformity.  Bilateral edema. SKIN: Stage II coccygeal pressure skin injury. NEURO: Awake.  Oriented only to self.  No apparent focal neuro deficit but limited exam. PSYCH: Calm.  No distress or agitation.  Discharge Instructions  Discharge Instructions    Diet general   Complete by: As directed    Discharge wound care:   Complete by: As directed    Foam dressing, frequent turning every 2 hours and monitoring     Allergies as of 10/13/2020  Reactions   Penicillins Hives, Rash      Medication List    STOP taking these medications   oxyCODONE 5 MG immediate release tablet Commonly known as: Oxy IR/ROXICODONE     TAKE these medications   diclofenac Sodium 1 %  Gel Commonly known as: VOLTAREN Apply 2 g topically daily as needed (shoulder pain).   docusate sodium 100 MG capsule Commonly known as: COLACE Take 100 mg by mouth daily as needed (constipation).   ipratropium 0.03 % nasal spray Commonly known as: ATROVENT Place 2 sprays into both nostrils 2 (two) times daily as needed (allergies/congestion).   latanoprost 0.005 % ophthalmic solution Commonly known as: XALATAN Place 1 drop into both eyes at bedtime.   LORazepam 0.5 MG tablet Commonly known as: Ativan Take 1 tablet (0.5 mg total) by mouth every 4 (four) hours as needed for up to 20 doses for anxiety.   Lotemax SM 0.38 % Gel Generic drug: Loteprednol Etabonate Place 1 drop into both eyes 4 (four) times daily.   midodrine 5 MG tablet Commonly known as: PROAMATINE Take 1 tablet (5 mg total) by mouth 3 (three) times daily with meals.   morphine CONCENTRATE 10 mg / 0.5 ml concentrated solution Take 0.5 mLs (10 mg total) by mouth every 3 (three) hours as needed for moderate pain, severe pain or shortness of breath.   naloxone 4 MG/0.1ML Liqd nasal spray kit Commonly known as: NARCAN Place 1 spray into the nose once as needed (opioid overdose).   ondansetron 4 MG disintegrating tablet Commonly known as: Zofran ODT Take 1 tablet (4 mg total) by mouth every 8 (eight) hours as needed for nausea or vomiting.   polyethylene glycol 17 g packet Commonly known as: MiraLax Take 17 g by mouth daily as needed (constipation).   Simbrinza 1-0.2 % Susp Generic drug: Brinzolamide-Brimonidine Place 1 drop into both eyes 2 (two) times daily.   timolol 0.5 % ophthalmic solution Commonly known as: BETIMOL Place 1 drop into both eyes every morning.            Discharge Care Instructions  (From admission, onward)         Start     Ordered   10/13/20 0000  Discharge wound care:       Comments: Foam dressing, frequent turning every 2 hours and monitoring   10/13/20 1059           Consultations:  Intensivist  Palliative medicine  Procedures/Studies:  Right chest tube from 10/08/2020-10/10/2020   DG Chest 1 View  Result Date: 10/08/2020 CLINICAL DATA:  Sepsis.  Prostate cancer. EXAM: CHEST  1 VIEW COMPARISON:  07/04/1999 FINDINGS: Heart size and vascularity normal.  Left lung clear Right lower lobe atelectasis/infiltrate. Probable right-sided pneumothorax. Skin fold considered less likely. Correlate with symptoms. Follow-up chest x-ray could confirm. Small right effusion Sclerotic skeletal lesions compatible with diffuse bony metastatic disease. IMPRESSION: Probable right pneumothorax and right lower lobe atelectasis. Small right effusion. Recommend follow-up chest x-ray to confirm. Diffuse bony metastasis. Electronically Signed   By: Franchot Gallo M.D.   On: 10/08/2020 18:40   CT CHEST ABDOMEN PELVIS W CONTRAST  Result Date: 10/08/2020 CLINICAL DATA:  History of prostate carcinoma and recent chest tube placement EXAM: CT CHEST, ABDOMEN, AND PELVIS WITH CONTRAST TECHNIQUE: Multidetector CT imaging of the chest, abdomen and pelvis was performed following the standard protocol during bolus administration of intravenous contrast. CONTRAST:  180m OMNIPAQUE IOHEXOL 300 MG/ML  SOLN COMPARISON:  Chest x-rays from  earlier in the same day. FINDINGS: CT CHEST FINDINGS Cardiovascular: Thoracic aorta and its branches are within normal limits. Mild atherosclerotic calcifications are seen. No aneurysmal dilatation or dissection is seen. Heart size is within normal limits. Pulmonary artery shows no large central pulmonary embolus. Mediastinum/Nodes: Thoracic inlet is within normal limits. No sizable hilar or mediastinal adenopathy is noted. The esophagus as visualized is within normal limits. Lungs/Pleura: New chest tube is been placed on the right and lies within the major fissure and extends to the right cardiac border. Bilateral pleural effusions are noted large on the right and  small on the left. The pleural fluid on the right is not being adequately presented to the chest tube. Small anteromedial pneumothorax is noted on the right. Right lower lobe and middle lobe atelectatic changes are seen. Patchy ground-glass changes are noted in the right lower lobe. Correlate with COVID-19 testing which is pending. Left lung demonstrates some lower lobe atelectatic changes as well. Musculoskeletal: Diffuse bony metastatic disease is noted. No compression deformity or acute rib abnormality is seen. These changes are stable from previous exams. CT ABDOMEN PELVIS FINDINGS Hepatobiliary: Innumerable hepatic metastatic lesions are noted which have increased in both size and number when compared with the prior exam. Gallbladder has been surgically removed. Pancreas: Unremarkable. No pancreatic ductal dilatation or surrounding inflammatory changes. Spleen: Mottled density within the spleen is noted suspicious for metastatic disease as well. Adrenals/Urinary Tract: Adrenal glands are within normal limits. Kidneys demonstrate a normal enhancement pattern. No renal calculi or obstructive changes are noted. Bladder is well distended. Stomach/Bowel: Appendix is not well visualized. No obstructive or inflammatory changes of the colon are seen. Diverticular change of the colon is noted without diverticulitis. The stomach and small bowel appear within normal limits. Vascular/Lymphatic: Aortic atherosclerosis. No enlarged abdominal or pelvic lymph nodes. Reproductive: Prostate demonstrates planning markers within. Other: Ascites is seen increased when compared with the prior exam. Mild changes of anasarca are noted as well. Musculoskeletal: Diffuse sclerotic bony metastatic disease is noted without evidence of compression deformity. IMPRESSION: CT of the chest: Bilateral pleural effusions right considerably greater than left. The indwelling chest tube on the right is not within the pleural effusion and may not  affect adequate drainage. Small right anteromedial pneumothorax. Given the tube position within the major fissure this may also not resolve with the current tube drainage. Diffuse bony metastatic disease. CT of the abdomen and pelvis: Hepatic metastatic disease which is increased in both size and number when compared with the prior study. Diverticulosis without diverticulitis. New ascites and mild changes of anasarca. Diffuse sclerotic bony metastatic disease consistent with the given clinical history. Electronically Signed   By: Inez Catalina M.D.   On: 10/08/2020 23:48   DG Chest Port 1 View  Result Date: 10/11/2020 CLINICAL DATA:  Pleural effusion EXAM: PORTABLE CHEST 1 VIEW COMPARISON:  Yesterday FINDINGS: Normal heart size and mediastinal contours. Continued hazy left chest and indistinct left diaphragm from atelectasis and pleural fluid by CT. Widespread osseous metastatic disease. IMPRESSION: Unchanged hazy left chest opacity from atelectasis and pleural fluid. Electronically Signed   By: Monte Fantasia M.D.   On: 10/11/2020 05:12   DG Chest Port 1 View  Result Date: 10/10/2020 CLINICAL DATA:  Chest tube removal EXAM: PORTABLE CHEST 1 VIEW COMPARISON:  10/10/2020 FINDINGS: Right basilar chest tube removed. Probable minimal right apical pneumothorax. No pleural effusion. Left lower lobe atelectasis/infiltrate unchanged. Diffuse osseous metastatic disease unchanged. IMPRESSION: Right chest tube removed.  Minimal right  apical pneumothorax. No change left lower lobe airspace disease. Electronically Signed   By: Franchot Gallo M.D.   On: 10/10/2020 14:25   DG Chest Port 1 View  Result Date: 10/10/2020 CLINICAL DATA:  Pneumothorax on the right EXAM: PORTABLE CHEST 1 VIEW COMPARISON:  Yesterday FINDINGS: Chest tube at the right base.  Trace right apical pneumothorax. Hazy density of the left more than right chest, atelectasis and pleural fluid based on recent chest CT. Normal heart size. Confluent  osseous metastatic disease. IMPRESSION: 1. Trace right apical pneumothorax. 2. Hazy density of the left chest from atelectasis and pleural fluid by recent chest CT. Electronically Signed   By: Monte Fantasia M.D.   On: 10/10/2020 05:41   DG CHEST PORT 1 VIEW  Result Date: 10/09/2020 CLINICAL DATA:  Pneumothorax. EXAM: PORTABLE CHEST 1 VIEW COMPARISON:  Chest x-rays dated 10/08/2020 FINDINGS: Heart size and mediastinal contours are within normal limits. RIGHT-sided chest tube is stable in position at the RIGHT lung base. No residual pneumothorax is seen. Probable mild bilateral edema and/or small layering pleural effusions. Diffuse osseous metastases better demonstrated on recent chest CT of 10/08/2020. IMPRESSION: 1. No pneumothorax is seen. RIGHT-sided chest tube is stable in position. 2. Probable bilateral interstitial edema and/or small layering pleural effusions. 3. Diffuse osseous metastases. Electronically Signed   By: Franki Cabot M.D.   On: 10/09/2020 09:13   DG Chest Port 1 View  Result Date: 10/08/2020 CLINICAL DATA:  Chest tube placement EXAM: PORTABLE CHEST 1 VIEW COMPARISON:  10/08/2020, CT 07/05/2019 FINDINGS: Interval placement of right-sided chest tube with pigtail overlying the spine. Asymmetric hazy opacity in the right thorax could be secondary to pleural effusion or asymmetric edema. No definitive pneumothorax. Diffuse skeletal sclerosis consistent with metastatic disease. IMPRESSION: Interval placement of right-sided chest tube with asymmetric hazy opacity of the right thorax, question pleural effusion or asymmetric edema. No pneumothorax. Electronically Signed   By: Donavan Foil M.D.   On: 10/08/2020 22:44   DG Abd 2 Views  Result Date: 10/08/2020 CLINICAL DATA:  Sepsis EXAM: ABDOMEN - 2 VIEW COMPARISON:  CT abdomen pelvis 08/28/2020 FINDINGS: Normal bowel gas pattern. No obstruction or ileus. No free air on upright view. Widespread bony metastatic disease. IMPRESSION: Normal  bowel gas pattern Diffuse skeletal metastasis. Electronically Signed   By: Franchot Gallo M.D.   On: 10/08/2020 18:41        The results of significant diagnostics from this hospitalization (including imaging, microbiology, ancillary and laboratory) are listed below for reference.     Microbiology: Recent Results (from the past 240 hour(s))  SARS CORONAVIRUS 2 (TAT 6-24 HRS) Nasopharyngeal Nasopharyngeal Swab     Status: None   Collection Time: 10/08/20  6:29 PM   Specimen: Nasopharyngeal Swab  Result Value Ref Range Status   SARS Coronavirus 2 NEGATIVE NEGATIVE Final    Comment: (NOTE) SARS-CoV-2 target nucleic acids are NOT DETECTED.  The SARS-CoV-2 RNA is generally detectable in upper and lower respiratory specimens during the acute phase of infection. Negative results do not preclude SARS-CoV-2 infection, do not rule out co-infections with other pathogens, and should not be used as the sole basis for treatment or other patient management decisions. Negative results must be combined with clinical observations, patient history, and epidemiological information. The expected result is Negative.  Fact Sheet for Patients: SugarRoll.be  Fact Sheet for Healthcare Providers: https://www.woods-mathews.com/  This test is not yet approved or cleared by the [place] FDA and  has been authorized for  detection and/or diagnosis of SARS-CoV-2 by FDA under an Emergency Use Authorization (EUA). This EUA will remain  in effect (meaning this test can be used) for the duration of the COVID-19 declaration under Se ction 564(b)(1) of the Act, 21 U.S.C. section 360bbb-3(b)(1), unless the authorization is terminated or revoked sooner.  Performed at [facility], [place] [address], [place], [place] [address]   Blood culture (routine single)     Status: None   Collection Time: 10/08/20  9:23 PM   Specimen: BLOOD LEFT ARM  Result Value Ref Range  Status   Specimen Description BLOOD LEFT ARM  Final   Special Requests   Final    BOTTLES DRAWN AEROBIC AND ANAEROBIC Blood Culture adequate volume   Culture   Final    NO GROWTH 5 DAYS Performed at [facility], [place] [address], [place], [place] [address]    Report Status 10/13/2020 FINAL  Final  MRSA PCR Screening     Status: None   Collection Time: 10/09/20  2:23 AM   Specimen: Nasal Mucosa; Nasopharyngeal  Result Value Ref Range Status   MRSA by PCR NEGATIVE NEGATIVE Final    Comment:        The GeneXpert MRSA Assay (FDA approved for NASAL specimens only), is one component of a comprehensive MRSA colonization surveillance program. It is not intended to diagnose MRSA infection nor to guide or monitor treatment for MRSA infections. Performed at [facility], [place] [address], [place], [place] [address]   Culture, blood (routine x 2)     Status: None (Preliminary result)   Collection Time: 10/09/20  3:07 AM   Specimen: BLOOD  Result Value Ref Range Status   Specimen Description BLOOD LEFT ANTECUBITAL  Final   Special Requests   Final    BOTTLES DRAWN AEROBIC AND ANAEROBIC Blood Culture adequate volume   Culture   Final    NO GROWTH 4 DAYS Performed at [facility], [place] [address] [place], [place] [address]    Report Status PENDING  Incomplete  Culture, blood (routine x 2)     Status: None (Preliminary result)   Collection Time: 10/09/20  3:12 AM   Specimen: BLOOD  Result Value Ref Range Status   Specimen Description BLOOD RIGHT ANTECUBITAL  Final   Special Requests   Final    BOTTLES DRAWN AEROBIC ONLY Blood Culture results may not be optimal due to an inadequate volume of blood received in culture bottles   Culture   Final    NO GROWTH 4 DAYS Performed at [facility], [place] [address], [place], [place] [address]    Report Status PENDING  Incomplete  Urine culture     Status: Abnormal   Collection Time: 10/09/20  5:24 AM   Specimen: In/Out  Cath Urine  Result Value Ref Range Status   Specimen Description IN/OUT CATH URINE  Final   Special Requests NONE  Final   Culture (A)  Final    >=100,000 COLONIES/mL STREPTOCOCCUS AGALACTIAE TESTING AGAINST S. AGALACTIAE NOT ROUTINELY PERFORMED DUE TO PREDICTABILITY OF AMP/PEN/VAN SUSCEPTIBILITY. Performed at [facility], [place] [address], [place], [place] [address]    Report Status 10/10/2020 FINAL  Final     Labs:  CBC: Recent Labs  Lab 10/08/20 1817 10/09/20 0206 10/09/20 0305 10/10/20 0117 10/11/20 0622  WBC 3.4* 2.6* 2.3* 1.5* 1.9*  NEUTROABS 1.2*  --   --   --  0.7*  HGB  5.6* 8.6* 8.4* 6.6* 7.3*  HCT 17.6* 25.7* 25.4* 20.0* 22.5*  MCV 94.6 91.8 91.7 93.5 91.1  PLT 8* 48* 45* 25* 20*   BMP &GFR Recent Labs  Lab 10/08/20 1817 10/09/20 0206 10/10/20 0117 10/11/20 0622  NA 136 136 135 135  K 4.2 4.3 4.3 4.2  CL 105 103 108 105  CO2 16* 20* 16* 19*  GLUCOSE 103* 109* 179* 101*  BUN 27* 27* 31* 40*  CREATININE 1.14 1.02 1.22 1.76*  CALCIUM 8.0* 7.9* 7.7* 8.2*  MG  --  1.6* 1.9 2.1  PHOS  --  5.0*  --   --    Estimated Creatinine Clearance: 36.5 mL/min (A) (by C-G formula based on SCr of 1.76 mg/dL (H)). Liver & Pancreas: Recent Labs  Lab 10/08/20 1817  AST 58*  ALT 19  ALKPHOS 214*  BILITOT 2.0*  PROT 4.5*  ALBUMIN 2.0*   No results for input(s): LIPASE, AMYLASE in the last 168 hours. No results for input(s): AMMONIA in the last 168 hours. Diabetic: No results for input(s): HGBA1C in the last 72 hours. Recent Labs  Lab 10/13/20 0355 10/13/20 0711 10/13/20 0835 10/13/20 0940 10/13/20 1112  GLUCAP 116* 69* 69* 97 85   Cardiac Enzymes: No results for input(s): CKTOTAL, CKMB, CKMBINDEX, TROPONINI in the last 168 hours. No results for input(s): PROBNP in the last 8760 hours. Coagulation Profile: Recent Labs  Lab 10/08/20 1817  INR 1.6*   Thyroid Function Tests: No results for input(s): TSH, T4TOTAL, FREET4, T3FREE, THYROIDAB in the  last 72 hours. Lipid Profile: No results for input(s): CHOL, HDL, LDLCALC, TRIG, CHOLHDL, LDLDIRECT in the last 72 hours. Anemia Panel: No results for input(s): VITAMINB12, FOLATE, FERRITIN, TIBC, IRON, RETICCTPCT in the last 72 hours. Urine analysis:    Component Value Date/Time   COLORURINE AMBER (A) 10/09/2020 0524   APPEARANCEUR HAZY (A) 10/09/2020 0524   LABSPEC 1.028 10/09/2020 0524   PHURINE 5.0 10/09/2020 0524   GLUCOSEU NEGATIVE 10/09/2020 0524   HGBUR NEGATIVE 10/09/2020 0524   BILIRUBINUR NEGATIVE 10/09/2020 [place] 10/09/2020 0524   PROTEINUR NEGATIVE 10/09/2020 0524   UROBILINOGEN 0.2 01/03/2017 1742   NITRITE NEGATIVE 10/09/2020 0524   LEUKOCYTESUR NEGATIVE 10/09/2020 0524   Sepsis Labs: Invalid input(s): PROCALCITONIN, LACTICIDVEN   Time coordinating discharge: 35 minutes  SIGNED:  Mercy Riding, MD  [facility] 10/13/2020, 11:38 AM  If 7PM-7AM, please contact night-coverage [email or web address]

## 2020-10-13 NOTE — Progress Notes (Signed)
Patient discharged home  to [facility] via [facility] transport.

## 2020-10-14 LAB — CULTURE, BLOOD (ROUTINE X 2)
Culture: NO GROWTH
Culture: NO GROWTH
Special Requests: ADEQUATE

## 2020-11-09 DEATH — deceased
